# Patient Record
Sex: Female | Born: 1950 | Race: Black or African American | Hispanic: No | Smoking: Never smoker
Health system: Southern US, Community
[De-identification: ages and names within clinical notes are randomized; demographics above are authoritative.]

## PROBLEM LIST (undated history)

## (undated) DIAGNOSIS — Z9289 Personal history of other medical treatment: Secondary | ICD-10-CM

## (undated) DIAGNOSIS — I10 Essential (primary) hypertension: Secondary | ICD-10-CM

## (undated) DIAGNOSIS — K219 Gastro-esophageal reflux disease without esophagitis: Secondary | ICD-10-CM

## (undated) DIAGNOSIS — D649 Anemia, unspecified: Secondary | ICD-10-CM

## (undated) DIAGNOSIS — C4920 Malignant neoplasm of connective and soft tissue of unspecified lower limb, including hip: Secondary | ICD-10-CM

---

## 1992-11-30 HISTORY — PX: FOOT AMPUTATION: SHX951

## 2009-11-29 ENCOUNTER — Emergency Department (HOSPITAL_COMMUNITY): Admission: EM | Admit: 2009-11-29 | Discharge: 2009-11-29 | Payer: Self-pay | Admitting: Emergency Medicine

## 2009-12-18 ENCOUNTER — Encounter: Payer: Self-pay | Admitting: Internal Medicine

## 2010-12-21 ENCOUNTER — Encounter: Payer: Self-pay | Admitting: General Practice

## 2010-12-30 NOTE — Letter (Signed)
Summary: Pre Visit No Show Letter  [facility]  [address] [place], [place] [address]   Phone: [phone]  Fax: [phone]        December 18, 2009 MRN: [id number]    [facility] [address] [place], [place]  [address]    Dear Ms. Ey,   We have been unable to reach you by phone concerning the pre-procedure visit that you missed on 12/18/09. For this reason,your procedure scheduled on 12/31/09 has been cancelled. Our scheduling staff will gladly assist you with rescheduling your appointments at a more convenient time. Please call our office at [phone] between the hours of 8:00am and 5:00pm, press option #2 to reach an appointment scheduler. Please consider updating your contact numbers at this time so that we can reach you by phone in the future with schedule changes or results.    Thank you,    Wyona Almas RN [facility]

## 2011-01-06 ENCOUNTER — Other Ambulatory Visit: Payer: Self-pay | Admitting: Family Medicine

## 2011-01-06 DIAGNOSIS — Z1231 Encounter for screening mammogram for malignant neoplasm of breast: Secondary | ICD-10-CM

## 2011-01-14 ENCOUNTER — Ambulatory Visit
Admission: RE | Admit: 2011-01-14 | Discharge: 2011-01-14 | Disposition: A | Discharge status: Home / Self Care | Payer: Federal, State, Local not specified - PPO | Source: Ambulatory Visit | Attending: Family Medicine | Admitting: Family Medicine

## 2011-01-14 DIAGNOSIS — Z1231 Encounter for screening mammogram for malignant neoplasm of breast: Secondary | ICD-10-CM

## 2011-01-30 ENCOUNTER — Other Ambulatory Visit (HOSPITAL_COMMUNITY)
Admission: RE | Admit: 2011-01-30 | Discharge: 2011-01-30 | Disposition: A | Discharge status: Home / Self Care | Payer: Federal, State, Local not specified - PPO | Source: Ambulatory Visit | Attending: Family Medicine | Admitting: Family Medicine

## 2011-01-30 ENCOUNTER — Other Ambulatory Visit: Payer: Self-pay | Admitting: Family Medicine

## 2011-01-30 DIAGNOSIS — Z124 Encounter for screening for malignant neoplasm of cervix: Secondary | ICD-10-CM | POA: Insufficient documentation

## 2018-03-09 ENCOUNTER — Other Ambulatory Visit (HOSPITAL_COMMUNITY): Payer: Federal, State, Local not specified - PPO

## 2018-03-09 ENCOUNTER — Other Ambulatory Visit: Payer: Self-pay

## 2018-03-09 ENCOUNTER — Observation Stay (HOSPITAL_BASED_OUTPATIENT_CLINIC_OR_DEPARTMENT_OTHER): Payer: Federal, State, Local not specified - PPO

## 2018-03-09 ENCOUNTER — Encounter (HOSPITAL_COMMUNITY): Payer: Self-pay

## 2018-03-09 ENCOUNTER — Observation Stay (HOSPITAL_COMMUNITY)
Admission: EM | Admit: 2018-03-09 | Discharge: 2018-03-10 | Disposition: A | Discharge status: Home / Self Care | Payer: Federal, State, Local not specified - PPO | Attending: Student in an Organized Health Care Education/Training Program | Admitting: Student in an Organized Health Care Education/Training Program

## 2018-03-09 DIAGNOSIS — D649 Anemia, unspecified: Principal | ICD-10-CM | POA: Insufficient documentation

## 2018-03-09 DIAGNOSIS — D509 Iron deficiency anemia, unspecified: Secondary | ICD-10-CM | POA: Diagnosis present

## 2018-03-09 DIAGNOSIS — K0889 Other specified disorders of teeth and supporting structures: Secondary | ICD-10-CM | POA: Diagnosis not present

## 2018-03-09 DIAGNOSIS — R9431 Abnormal electrocardiogram [ECG] [EKG]: Secondary | ICD-10-CM | POA: Diagnosis not present

## 2018-03-09 DIAGNOSIS — R42 Dizziness and giddiness: Secondary | ICD-10-CM | POA: Diagnosis not present

## 2018-03-09 DIAGNOSIS — R51 Headache: Secondary | ICD-10-CM | POA: Diagnosis not present

## 2018-03-09 DIAGNOSIS — R739 Hyperglycemia, unspecified: Secondary | ICD-10-CM | POA: Diagnosis not present

## 2018-03-09 DIAGNOSIS — I129 Hypertensive chronic kidney disease with stage 1 through stage 4 chronic kidney disease, or unspecified chronic kidney disease: Secondary | ICD-10-CM | POA: Diagnosis not present

## 2018-03-09 DIAGNOSIS — I361 Nonrheumatic tricuspid (valve) insufficiency: Secondary | ICD-10-CM

## 2018-03-09 DIAGNOSIS — Z9889 Other specified postprocedural states: Secondary | ICD-10-CM

## 2018-03-09 DIAGNOSIS — Z8249 Family history of ischemic heart disease and other diseases of the circulatory system: Secondary | ICD-10-CM

## 2018-03-09 DIAGNOSIS — Z85831 Personal history of malignant neoplasm of soft tissue: Secondary | ICD-10-CM

## 2018-03-09 DIAGNOSIS — R0602 Shortness of breath: Secondary | ICD-10-CM | POA: Diagnosis not present

## 2018-03-09 DIAGNOSIS — J302 Other seasonal allergic rhinitis: Secondary | ICD-10-CM | POA: Diagnosis not present

## 2018-03-09 DIAGNOSIS — I10 Essential (primary) hypertension: Secondary | ICD-10-CM

## 2018-03-09 DIAGNOSIS — N183 Chronic kidney disease, stage 3 (moderate): Secondary | ICD-10-CM

## 2018-03-09 DIAGNOSIS — R12 Heartburn: Secondary | ICD-10-CM

## 2018-03-09 DIAGNOSIS — R110 Nausea: Secondary | ICD-10-CM | POA: Diagnosis not present

## 2018-03-09 DIAGNOSIS — Z89431 Acquired absence of right foot: Secondary | ICD-10-CM

## 2018-03-09 DIAGNOSIS — Z859 Personal history of malignant neoplasm, unspecified: Secondary | ICD-10-CM | POA: Insufficient documentation

## 2018-03-09 DIAGNOSIS — R011 Cardiac murmur, unspecified: Secondary | ICD-10-CM | POA: Diagnosis not present

## 2018-03-09 DIAGNOSIS — E876 Hypokalemia: Secondary | ICD-10-CM | POA: Diagnosis not present

## 2018-03-09 DIAGNOSIS — Z833 Family history of diabetes mellitus: Secondary | ICD-10-CM

## 2018-03-09 DIAGNOSIS — Z9289 Personal history of other medical treatment: Secondary | ICD-10-CM

## 2018-03-09 DIAGNOSIS — R197 Diarrhea, unspecified: Secondary | ICD-10-CM | POA: Diagnosis not present

## 2018-03-09 DIAGNOSIS — E669 Obesity, unspecified: Secondary | ICD-10-CM

## 2018-03-09 DIAGNOSIS — Z86010 Personal history of colonic polyps: Secondary | ICD-10-CM

## 2018-03-09 HISTORY — DX: Personal history of other medical treatment: Z92.89

## 2018-03-09 HISTORY — DX: Essential (primary) hypertension: I10

## 2018-03-09 HISTORY — DX: Iron deficiency anemia, unspecified: D50.9

## 2018-03-09 HISTORY — DX: Gastro-esophageal reflux disease without esophagitis: K21.9

## 2018-03-09 HISTORY — DX: Malignant neoplasm of connective and soft tissue of unspecified lower limb, including hip: C49.20

## 2018-03-09 HISTORY — DX: Anemia, unspecified: D64.9

## 2018-03-09 LAB — COMPREHENSIVE METABOLIC PANEL
ALBUMIN: 4.1 g/dL (ref 3.5–5.0)
ALT: 16 U/L (ref 14–54)
AST: 21 U/L (ref 15–41)
Alkaline Phosphatase: 45 U/L (ref 38–126)
Anion gap: 15 (ref 5–15)
BUN: 12 mg/dL (ref 6–20)
CHLORIDE: 102 mmol/L (ref 101–111)
CO2: 22 mmol/L (ref 22–32)
Calcium: 9.3 mg/dL (ref 8.9–10.3)
Creatinine, Ser: 1.12 mg/dL — ABNORMAL HIGH (ref 0.44–1.00)
GFR calc Af Amer: 58 mL/min — ABNORMAL LOW (ref >60)
GFR calc non Af Amer: 50 mL/min — ABNORMAL LOW (ref >60)
GLUCOSE: 190 mg/dL — AB (ref 65–99)
POTASSIUM: 2.6 mmol/L — AB (ref 3.5–5.1)
Sodium: 139 mmol/L (ref 135–145)
Total Bilirubin: 0.5 mg/dL (ref 0.3–1.2)
Total Protein: 7.5 g/dL (ref 6.5–8.1)

## 2018-03-09 LAB — TSH: TSH: 1.963 u[IU]/mL (ref 0.350–4.500)

## 2018-03-09 LAB — HEMOGLOBIN A1C
Hgb A1c MFr Bld: 5.9 % — ABNORMAL HIGH (ref 4.8–5.6)
Mean Plasma Glucose: 122.63 mg/dL

## 2018-03-09 LAB — CBC
HEMATOCRIT: 24 % — AB (ref 36.0–46.0)
Hemoglobin: 7.1 g/dL — ABNORMAL LOW (ref 12.0–15.0)
MCH: 18.7 pg — ABNORMAL LOW (ref 26.0–34.0)
MCHC: 29.6 g/dL — AB (ref 30.0–36.0)
MCV: 63.3 fL — AB (ref 78.0–100.0)
Platelets: 405 10*3/uL — ABNORMAL HIGH (ref 150–400)
RBC: 3.79 MIL/uL — ABNORMAL LOW (ref 3.87–5.11)
RDW: 21 % — AB (ref 11.5–15.5)
WBC: 7.7 10*3/uL (ref 4.0–10.5)

## 2018-03-09 LAB — LIPASE, BLOOD: LIPASE: 40 U/L (ref 11–51)

## 2018-03-09 LAB — URINALYSIS, ROUTINE W REFLEX MICROSCOPIC
BILIRUBIN URINE: NEGATIVE
GLUCOSE, UA: 150 mg/dL — AB
Hgb urine dipstick: NEGATIVE
KETONES UR: NEGATIVE mg/dL
Leukocytes, UA: NEGATIVE
Nitrite: NEGATIVE
PH: 8 (ref 5.0–8.0)
Protein, ur: NEGATIVE mg/dL
Specific Gravity, Urine: 1.006 (ref 1.005–1.030)

## 2018-03-09 LAB — BASIC METABOLIC PANEL
ANION GAP: 12 (ref 5–15)
BUN: 9 mg/dL (ref 6–20)
CO2: 22 mmol/L (ref 22–32)
Calcium: 9.4 mg/dL (ref 8.9–10.3)
Chloride: 105 mmol/L (ref 101–111)
Creatinine, Ser: 0.99 mg/dL (ref 0.44–1.00)
GFR calc Af Amer: >60 mL/min (ref >60)
GFR calc non Af Amer: 58 mL/min — ABNORMAL LOW (ref >60)
Glucose, Bld: 118 mg/dL — ABNORMAL HIGH (ref 65–99)
POTASSIUM: 3.4 mmol/L — AB (ref 3.5–5.1)
SODIUM: 139 mmol/L (ref 135–145)

## 2018-03-09 LAB — SAVE SMEAR

## 2018-03-09 LAB — LACTATE DEHYDROGENASE: LDH: 192 U/L (ref 98–192)

## 2018-03-09 LAB — IRON AND TIBC
IRON: 9 ug/dL — AB (ref 28–170)
Saturation Ratios: 2 % — ABNORMAL LOW (ref 10.4–31.8)
TIBC: 518 ug/dL — ABNORMAL HIGH (ref 250–450)
UIBC: 509 ug/dL

## 2018-03-09 LAB — RETICULOCYTES
RBC.: 3.98 MIL/uL (ref 3.87–5.11)
RETIC COUNT ABSOLUTE: 59.7 10*3/uL (ref 19.0–186.0)
Retic Ct Pct: 1.5 % (ref 0.4–3.1)

## 2018-03-09 LAB — MAGNESIUM: Magnesium: 2 mg/dL (ref 1.7–2.4)

## 2018-03-09 LAB — ECHOCARDIOGRAM COMPLETE
Height: 67 in
WEIGHTICAEL: 3040 [oz_av]

## 2018-03-09 LAB — FERRITIN: FERRITIN: 3 ng/mL — AB (ref 11–307)

## 2018-03-09 LAB — PREPARE RBC (CROSSMATCH)

## 2018-03-09 LAB — ABO/RH: ABO/RH(D): O POS

## 2018-03-09 LAB — TROPONIN I

## 2018-03-09 LAB — POC OCCULT BLOOD, ED: Fecal Occult Bld: NEGATIVE

## 2018-03-09 MED ORDER — POTASSIUM CHLORIDE CRYS ER 20 MEQ PO TBCR
40.0000 meq | EXTENDED_RELEASE_TABLET | Freq: Once | ORAL | Status: AC
  Administered 2018-03-09: 40 meq via ORAL
  Filled 2018-03-09: qty 2

## 2018-03-09 MED ORDER — PROMETHAZINE HCL 25 MG/ML IJ SOLN: 12.5000 mg | Freq: Four times a day (QID) | INTRAMUSCULAR | Status: DC | PRN

## 2018-03-09 MED ORDER — LOPERAMIDE HCL 2 MG PO CAPS: 2.0000 mg | ORAL_CAPSULE | ORAL | Status: DC | PRN

## 2018-03-09 MED ORDER — ONDANSETRON 4 MG PO TBDP
4.0000 mg | ORAL_TABLET | Freq: Once | ORAL | Status: AC | PRN
  Administered 2018-03-09: 4 mg via ORAL
  Filled 2018-03-09: qty 1

## 2018-03-09 MED ORDER — AMLODIPINE BESYLATE 5 MG PO TABS
5.0000 mg | ORAL_TABLET | Freq: Every day | ORAL | Status: DC
  Administered 2018-03-09 – 2018-03-10 (×2): 5 mg via ORAL
  Filled 2018-03-09 (×2): qty 1

## 2018-03-09 MED ORDER — CHLORTHALIDONE 25 MG PO TABS
25.0000 mg | ORAL_TABLET | Freq: Every day | ORAL | Status: DC
  Administered 2018-03-09 – 2018-03-10 (×2): 25 mg via ORAL
  Filled 2018-03-09 (×2): qty 1

## 2018-03-09 MED ORDER — FAMOTIDINE 10 MG PO TABS: 10.0000 mg | ORAL_TABLET | Freq: Every day | ORAL | Status: DC | PRN

## 2018-03-09 MED ORDER — ACETAMINOPHEN 500 MG PO TABS
1000.0000 mg | ORAL_TABLET | Freq: Four times a day (QID) | ORAL | Status: DC | PRN
  Administered 2018-03-09: 1000 mg via ORAL
  Filled 2018-03-09: qty 2

## 2018-03-09 MED ORDER — SODIUM CHLORIDE 0.9 % IV SOLN
Freq: Once | INTRAVENOUS | Status: AC
  Administered 2018-03-09: 1000 mL via INTRAVENOUS

## 2018-03-09 MED ORDER — POTASSIUM CHLORIDE 10 MEQ/100ML IV SOLN
10.0000 meq | Freq: Once | INTRAVENOUS | Status: AC
  Administered 2018-03-09: 10 meq via INTRAVENOUS
  Filled 2018-03-09: qty 100

## 2018-03-09 MED ORDER — HYDRALAZINE HCL 20 MG/ML IJ SOLN
5.0000 mg | Freq: Four times a day (QID) | INTRAMUSCULAR | Status: DC | PRN
Start: 2018-03-09 — End: 2018-03-10
  Administered 2018-03-09: 5 mg via INTRAVENOUS
  Administered 2018-03-10: 0.25 mg via INTRAVENOUS
  Filled 2018-03-09 (×2): qty 1

## 2018-03-09 NOTE — ED Notes (Signed)
Meal tray ordered for patient.

## 2018-03-09 NOTE — ED Notes (Signed)
GI Dr. At bedside.

## 2018-03-09 NOTE — ED Notes (Signed)
Pure wick in place 

## 2018-03-09 NOTE — Progress Notes (Signed)
  Echocardiogram 2D Echocardiogram has been performed.  Twanisha Foulk L Androw 03/09/2018, 3:09 PM

## 2018-03-09 NOTE — ED Triage Notes (Signed)
Pt arrives with family; pt states she woke up to go to the bathroom and had episodes of emesis and diarrhea causing some dizziness; Pt's family states she had similar episode on yesterday; pt denies eating foods in common with family member but states they both had drank the same store bought tea; Pt denies pain on arrival. Pt had 1 episode of diarrhea and 2 episodes of emesis; pt states she felt better after [facility]

## 2018-03-09 NOTE — ED Notes (Signed)
2 units of blood ready in the blood bank per Butch Penny. Nikki RN made aware @ [phone].

## 2018-03-09 NOTE — ED Notes (Signed)
Dr Dina Rich notified of hgb of 7.1 and k of 2.6

## 2018-03-09 NOTE — Consult Note (Signed)
Referring Provider:  Dr. Orland Jarred Primary Care Physician:  Patient, No Pcp Per Primary Gastroenterologist:  Dr. Acquanetta Sit  Reason for Consultation: Iron deficiency anemia  HPI: Kayla Williams is a 66 y.o. female admitted to the hospital today following a spell of weakness and dizziness.    On admission, she was found to have severe iron deficiency anemia, with a hemoglobin of 7.1, MCV 63, ferritin 3, iron saturation 2%.    However, it is noteworthy that the patient gives a history of chronic anemia, and her baseline hemoglobin is around 9.  Stool was Hemoccult negative.  The patient is not on any ulcerogenic medications nor chronic PPI therapy, although she does use occasional Zantac for postprandial reflux symptoms, which she has had for many years after eating spicy foods.  No alarm symptoms such as anorexia or weight loss, in fact, has actually gained weight recently.  Has regular bowel habits, with no visible bleeding such as melenic stools or maroon stools.  Apart from the occasional reflux and rare episodes of dysphagia (to meat or rice), the patient does not have any localizing GI tract symptoms.  In May 2011, the patient underwent endoscopy and colonoscopy by Dr. Acquanetta Sit because of heme positive stool.  At that time there was some mild gastritis but the test for H. pylori was negative, and the colonoscopy just showed 2 medium-sized adenomatous polyps (7 mm and 8 mm in size), for which the patient has not returned for follow-up.  The patient is currently being transfused.     Past Medical History:  Diagnosis Date  . Anemia   . Cancer ([place])   . Hypertension     History reviewed. No pertinent surgical history.  Prior to Admission medications   Not on File    Current Facility-Administered Medications  Medication Dose Route Frequency Provider Last Rate Last Dose  . amLODipine (NORVASC) tablet 5 mg  5 mg Oral Daily Shela Leff, MD   5 mg at 03/09/18 1302   . chlorthalidone (HYGROTON) tablet 25 mg  25 mg Oral Daily Shela Leff, MD   25 mg at 03/09/18 1701  . famotidine (PEPCID) tablet 10 mg  10 mg Oral Daily PRN Shela Leff, MD      . hydrALAZINE (APRESOLINE) injection 5 mg  5 mg Intravenous Q6H PRN Ledell Noss, MD   5 mg at 03/09/18 1712   No current outpatient medications on file.    Allergies as of 03/09/2018  . (No Known Allergies)    History reviewed. No pertinent family history.  Social History   Socioeconomic History  . Marital status: Divorced    Spouse name: Not on file  . Number of children: Not on file  . Years of education: Not on file  . Highest education level: Not on file  Occupational History  . Not on file  Social Needs  . Financial resource strain: Not on file  . Food insecurity:    Worry: Not on file    Inability: Not on file  . Transportation needs:    Medical: Not on file    Non-medical: Not on file  Tobacco Use  . Smoking status: Never Smoker  Substance and Sexual Activity  . Alcohol use: Not Currently  . Drug use: Not Currently  . Sexual activity: Not on file  Lifestyle  . Physical activity:    Days per week: Not on file    Minutes per session: Not on file  . Stress: Not on file  Relationships  . Social connections:    Talks on phone: Not on file    Gets together: Not on file    Attends religious service: Not on file    Active member of club or organization: Not on file    Attends meetings of clubs or organizations: Not on file    Relationship status: Not on file  . Intimate partner violence:    Fear of current or ex partner: Not on file    Emotionally abused: Not on file    Physically abused: Not on file    Forced sexual activity: Not on file  Other Topics Concern  . Not on file  Social History Narrative  . Not on file    Review of Systems: Negative for chest pain, shortness of breath, skin problems, urinary tract symptoms, or GI symptoms except per HPI.  Physical  Exam: Vital signs in last 24 hours: Temp:  [98 F (36.7 C)-99.5 F (37.5 C)] 99.5 F (37.5 C) (04/10 1715) Pulse Rate:  [70-93] 85 (04/10 1715) Resp:  [10-27] 27 (04/10 1715) BP: (167-255)/(59-128) 195/74 (04/10 1715) SpO2:  [98 %-100 %] 100 % (04/10 1715) Weight:  [86.2 kg (190 lb)] 86.2 kg (190 lb) (04/10 0515)   General:   Alert,  Well-developed, overweight, pleasant and cooperative African-American female in NAD Head:  Normocephalic and atraumatic. Eyes:  Sclera clear, no icterus.   Conjunctiva pink, s/p transfusion. Mouth:   No ulcerations or lesions.  Oropharynx pink & moist. Neck:   No masses or thyromegaly. Lungs:  Clear throughout to auscultation.   No wheezes, crackles, or rhonchi. No evident respiratory distress. Heart:   Regular rate and rhythm; no murmurs, clicks, rubs,  or gallops. Abdomen:  Soft, adipose, nontender. No masses, hepatosplenomegaly or ventral hernias noted. Msk:   Symmetrical without gross deformities status post partial amputation of 1 of her feet for malignancy. Extremities:   Without clubbing, cyanosis, or edema. Partial amputation of 1 foot noted. Neurologic:  Alert and coherent;  grossly normal neurologically. Skin:  Intact without significant lesions or rashes. Cervical Nodes:  No significant cervical adenopathy. Psych:   Alert and cooperative. Normal mood and affect.  Intake/Output from previous day: No intake/output data recorded. Intake/Output this shift: Total I/O In: 1099 [Blood:999; IV Piggyback:100] Out: -   Lab Results: Recent Labs    03/09/18 0517  WBC 7.7  HGB 7.1*  HCT 24.0*  PLT 405*   BMET Recent Labs    03/09/18 0517  NA 139  K 2.6*  CL 102  CO2 22  GLUCOSE 190*  BUN 12  CREATININE 1.12*  CALCIUM 9.3   LFT Recent Labs    03/09/18 0517  PROT 7.5  ALBUMIN 4.1  AST 21  ALT 16  ALKPHOS 45  BILITOT 0.5   PT/INR No results for input(s): LABPROT, INR in the last 72 hours.  Studies/Results: No results  found.  Impression: 1.  Acute on chronic anemia, with documented iron deficiency but heme-negative stool and no worrisome GI symptoms 2.  Mild reflux symptoms and mild dysphagia symptoms of long standing  Plan: In my opinion, the patient needs both endoscopy and colonoscopy.  However, especially with heme negative stool and no need for anticoagulation therapy, there is no urgency to performing these tests.  The patient prefers to have them done as an outpatient, which I feel is completely appropriate.  I have made her an appointment to see Dr. Acquanetta Sit in our office on April 23 at 11  AM to arrange egd/colonoscopy.  I will sign off.  However, please let me know if we can be of further assistance in this patient's care.   LOS: 0 days   Claudine Stallings V  03/09/2018, 5:48 PM   Pager [phone] If no answer or after 5 PM call [phone]

## 2018-03-09 NOTE — ED Notes (Signed)
Will preform Orthostatic Vital Signs when nurses finish hanging blood products.

## 2018-03-09 NOTE — ED Notes (Addendum)
Orthostatic vitals have been done and are documented in vital signs. See computer vitals from 1300 today.

## 2018-03-09 NOTE — H&P (Deleted)
Date: 03/09/2018               Patient Name:  Kayla Williams MRN: [id number]  DOB: 04/24/1951 Age / Sex: 67 y.o., female   PCP: Patient, No Pcp Per         Medical Service: Internal Medicine Teaching Service         Attending Physician: Dr. Evette Doffing, Mallie Mussel, *    First Contact: Miachel Roux, MS4 Pager: [phone]  Second Contact: Dr. Ledell Noss Pager: [phone]       After Hours (After 5p/  First Contact Pager: [phone]  weekends / holidays): Second Contact Pager: [phone]   Chief Complaint: dizziness  History of Present Illness: Kayla Williams is a 67 y.o. 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 She reports she awoke this morning around 4-5am and went to the bathroom to urinate. Upon returning to her bed, she began to feel as if the room was spinning and experienced nausea. She lay down and her symptoms improved, but she called to her boyfriend and daughter so that they may help her return to the bathroom. She was assisted to the bathroom, where she had a soft bowel movement. She subsequently continued to feel dizzy, weak and nauseated and was taken to the ED. She did not experience LOC, headache, vision changes, hearing changes, focal weakness, chest pain, SOB, abdominal pain, vomiting, diarrhea. She has not experienced any change in diet or fluid intake. She denies recent infectious symptoms. Postmenopausal for >10 years with no intermittent vaginal bleeding or spotting. Denied having melena or hematochezia.   Meds:  Occasional OTC Zantac and antacids  Allergies: Allergies as of 03/09/2018  . (No Known Allergies)   Past Medical History:  Diagnosis Date  . Anemia   . Cancer ([place])   . 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 - anemia  of uncertain etiology: pt was told her blood cells have "strange shape", which she states her twin sister also has - seasonal allergies treated intermittently with Zantac - hypertension, previously treated but discontinued per pt due to BP lowering with exercise - reports ambulatory monitoring due to nonpainful cardiac complaint - unremarkable per pt  Screening History: - normal mammography and Pap in 2012 per chart review. Pt reports colonoscopy performed in last 10 years with polyp removed, reportedly without atypical findings. Pt states she has not received other screening. No record of colonoscopy on Care Everywhere  Family History:  HTN - many members of family DM2 - brother  Social History: Ms. Carder lives with her boyfriend, daughter and granddaughter. She works in [employer] for the [employer] and is planning on retiring this year. She denies any tobacco, illicit drug use or alcohol use currently or in the past.   Review of Systems: Review of Systems  Constitutional: Negative for chills, diaphoresis, fever, malaise/fatigue and weight loss.  HENT: Negative for ear discharge, ear pain, hearing loss and tinnitus.   Eyes: Negative for blurred vision, double vision and discharge.  Respiratory: Negative for cough and shortness of breath.   Cardiovascular: Negative for chest pain, palpitations and leg swelling.  Gastrointestinal: Positive for heartburn and nausea. Negative for abdominal pain, constipation, diarrhea and vomiting.  Genitourinary: Positive for frequency. Negative for dysuria, flank pain, hematuria and urgency.  Musculoskeletal: Negative  for back pain, joint pain, myalgias and neck pain.  Skin: Negative for itching and rash.  Neurological: Positive for dizziness and weakness. Negative for sensory change, speech change, focal weakness, loss of consciousness and headaches.  Endo/Heme/Allergies: Does not bruise/bleed easily.    Physical Exam: Blood pressure (!) 182/79, pulse  80, temperature 99.2 F (37.3 C), temperature source Oral, resp. rate 14, height 5\' 7"  (1.702 m), weight 86.2 kg (190 lb), SpO2 100 %. General Appearance: Well developed, well nourished, in no acute distress laying in bed. Skin: Inspection of the skin reveals no rashes, ulcerations or petechiae. HEENT: Sclerae anicteric and conjunctivae pale and moist. EOMs intact, PERRLA. External inspection of the ears and nose showed no scars, lesions, or masses. Examination of ears with otoscope showing pearly white tympanic membranes; no erythema, drainage, or effusion noted. Lips, teeth, and gums showed normal mucosa. The oral mucosa, hard and soft palate, tongue and posterior pharynx were normal. Poor dentition with black, decaying teeth. Neck: Supple and symmetric. No thyroid enlargement, tenderness or masses.  Lungs: Normal WOB without any wheezing or crackles. Cardiovascular: Normal rate, regular rhythm, 2/6 crescendo-decrescendo systolic murmur best heard over LUSB. Carotid pulses normal and 2+ bilaterally without bruits. Peripheral pulses 2+ and symmetric. Abdomen: Soft and nontender with quiet bowel sounds. No palpable HSM.  Musculoskeletal: No joint tenderness or effusions noted. Muscle strength and tone normal. Extremities: No cyanosis, clubbing or edema. Neurologic: Alert and oriented x 4. CN II-XII grossly intact. No dysdiadochokinesia. HiNTS exam negative  Pertinent Labs: Hgb 7.1, MCV 63, Iron 9, TIBC 518, Ferritin 3, Platelets 405 K+ 2.6, glucose 190, Cr 1.12  EKG: personally reviewed my interpretation is questionable T wave abnormalities in inferior leads and NSR. Unable to compare with prior.   Assessment & Plan by Problem: Active Problems:   Symptomatic anemia  Symptomatic Iron deficiency anemia: Microcytic anemia with iron panel consistent with iron deficiency. Normal retic. Per Care Everywhere, pt had Hgb 10.8 with MCV 79 in 1996. Differential includes blood loss vs malabsorption vs  prolonged hemolysis. Blood loss workup thus far shows FOBT negative, no hematuria on UA. Hemolysis ruled out by normal bilirubin with LDH pending at this time. Smear in process. Spoke to GI as we are concerned she may have occult GIB. She will need colonoscopy and possible EGD to rule out mass or other source of bleeding.  - T&S followed by 2u pRBCs -F/u post-transfusion H/H -CBC in am -Goal Hgb >7 -Appreciate GI recs  Dizziness: Differential includes symptomatic anemia vs symptomatic severe range hypertension vs vasovagal vs arrhythmia (hypokalemic) vs neurological etiology. EKG in normal sinus rhythm and trop negative. Negative HiNTS exam, suggesting no central lesion. Meniere's unlikely given no hearing changes. BPPV unlikey given no positional provocation. No focal neurological findings suggestive of CVA. Orthostastic VS negative. -Continue to monitor on telemetry    Severe range hypertension with hypokalemia and decreased GFR: CKD 3a by GFR. Little historical data to determine temporality of kidney dysfunction; however, pt had nl Cr and K in 1996. Severe range HTN (220s/100s) with hypokalemia suggests secondary hypertension, with differential including hyperaldosteronism (primary vs secondary) vs ischemic nephropathy vs intrinsic renal disease. Other considerations include thyroid dysregulation or Cushing's syndrome although less likely based on history and exam.  -Start Chlorthalidone 25 mg daily -Start Amlodipine 5 mg daily  - plasma renin activity:plasma aldosterone concentration ratio - TSH  Hypokalemia: K 2.6 on admission. Unclear what is causing her to be hypokalemic. Not on any diuretics; no diarrhea. Questionable  TWI in inferior leads only on EKG; no prior EKG to compare. Hyperaldosteronism is a consideration in the setting of high blood pressures.  -Repeat BMP; monitor K level and replete as needed   Questionable TWI in inferior leads: No prior EKG to compare. Patient does not  endorse angina. Istat troponin negative.  -Echo to check for focal wall motion abnormality and further evaluation of murmur heard on exam  -Repeat EKG in am   Hyperglycemia: Given obesity, sedentary lifestyle, family history, pt likely has DM2 vs prediabetes.  - Hgb A1c  Diet: HH  DVT ppx: SCDs  Code status: Pt wishes to be be full code.  Dispo: Admit patient to Observation with expected length of stay less than 2 midnights.  Signed: Sarina Ser, Medical Student 03/09/2018, 2:20 PM  Pager: [phone]  Attestation for Student Documentation:  I personally was present and performed or re-performed the history, physical exam and medical decision-making activities of this service and have verified that the service and findings are accurately documented in the student's note.  Shela Leff, MD 03/09/2018, 3:02 PM

## 2018-03-09 NOTE — ED Provider Notes (Signed)
[facility] EMERGENCY DEPARTMENT Provider Note   CSN: [id number] Arrival date & time: 03/09/18  0503     History   Chief Complaint Chief Complaint  Patient presents with  . Emesis  . Diarrhea    HPI Kayla Williams is a 67 y.o. female.  HPI  67 year old female presents with a chief complaint of dizziness.  When she was getting up to go to the bathroom in the middle the night she felt dizzy like the room was spinning and she was going to pass out.  She was also nauseated.  She was given Zofran by a nurse upon arrival to ER and she feels better.  She denies any chest pain.  She has shortness of breath when she exerts herself but she feels like this is from being out of shape.  She denies any new or worsening shortness of breath.  She denies any rectal bleeding or vaginal bleeding.  No melena.  She states she has been told she has anemia but has not seen a doctor in a couple years.  Used to be on blood pressure medicine but took herself off and tried to control it with weight control.  She does not know what her hemoglobin normally is.  She had a couple episodes of diarrhea after the dizziness episode.  She has not been sick before last night.  Past Medical History:  Diagnosis Date  . Anemia   . Cancer ([place])   . Hypertension     Patient Active Problem List   Diagnosis Date Noted  . Symptomatic anemia 03/09/2018    History reviewed. No pertinent surgical history.   OB History   None      Home Medications    Prior to Admission medications   Not on File    Family History History reviewed. No pertinent family history.  Social History Social History   Tobacco Use  . Smoking status: Never Smoker  Substance Use Topics  . Alcohol use: Not Currently  . Drug use: Not Currently     Allergies   Patient has no known allergies.   Review of Systems Review of Systems  Constitutional: Negative for fever.  Respiratory: Positive for shortness of  breath (on exertion).   Cardiovascular: Negative for chest pain.  Gastrointestinal: Positive for diarrhea. Negative for abdominal pain and blood in stool.  Genitourinary: Negative for vaginal bleeding.  Neurological: Positive for dizziness. Negative for syncope and headaches.  All other systems reviewed and are negative.    Physical Exam Updated Vital Signs BP (!) 194/71   Pulse 74   Temp 99.2 F (37.3 C) (Oral)   Resp 17   Ht 5\' 7"  (1.702 m)   Wt 86.2 kg (190 lb)   SpO2 100%   BMI 29.76 kg/m   Physical Exam  Constitutional: She is oriented to person, place, and time. She appears well-developed and well-nourished. No distress.  HENT:  Head: Normocephalic and atraumatic.  Right Ear: External ear normal.  Left Ear: External ear normal.  Nose: Nose normal.  Eyes: Pupils are equal, round, and reactive to light. EOM are normal. Right eye exhibits no discharge. Left eye exhibits no discharge.  Cardiovascular: Normal rate and regular rhythm.  Murmur heard. Pulmonary/Chest: Effort normal and breath sounds normal.  Abdominal: Soft. There is no tenderness.  Genitourinary:  Genitourinary Comments: No hemorrhoids. Brown stool on rectal exam  Neurological: She is alert and oriented to person, place, and time.  CN 3-12 grossly intact. 5/5 strength  in all 4 extremities. Grossly normal sensation. Normal finger to nose.   Skin: Skin is warm and dry. She is not diaphoretic.  Nursing note and vitals reviewed.    ED Treatments / Results  Labs (all labs ordered are listed, but only abnormal results are displayed) Labs Reviewed  COMPREHENSIVE METABOLIC PANEL - Abnormal; Notable for the following components:      Result Value   Potassium 2.6 (*)    Glucose, Bld 190 (*)    Creatinine, Ser 1.12 (*)    GFR calc non Af Amer 50 (*)    GFR calc Af Amer 58 (*)    All other components within normal limits  CBC - Abnormal; Notable for the following components:   RBC 3.79 (*)    Hemoglobin  7.1 (*)    HCT 24.0 (*)    MCV 63.3 (*)    MCH 18.7 (*)    MCHC 29.6 (*)    RDW 21.0 (*)    Platelets 405 (*)    All other components within normal limits  URINALYSIS, ROUTINE W REFLEX MICROSCOPIC - Abnormal; Notable for the following components:   Color, Urine COLORLESS (*)    Glucose, UA 150 (*)    All other components within normal limits  IRON AND TIBC - Abnormal; Notable for the following components:   Iron 9 (*)    TIBC 518 (*)    Saturation Ratios 2 (*)    All other components within normal limits  FERRITIN - Abnormal; Notable for the following components:   Ferritin 3 (*)    All other components within normal limits  HEMOGLOBIN A1C - Abnormal; Notable for the following components:   Hgb A1c MFr Bld 5.9 (*)    All other components within normal limits  LIPASE, BLOOD  MAGNESIUM  RETICULOCYTES  TROPONIN I  SAVE SMEAR  TSH  BASIC METABOLIC PANEL  LACTATE DEHYDROGENASE  ALDOSTERONE + RENIN ACTIVITY W/ RATIO  POC OCCULT BLOOD, ED  TYPE AND SCREEN  ABO/RH  PREPARE RBC (CROSSMATCH)    EKG EKG Interpretation  Date/Time:  Wednesday March 09 2018 07:39:14 EDT Ventricular Rate:  76 PR Interval:  166 QRS Duration: 96 QT Interval:  440 QTC Calculation: 495 R Axis:   75 Text Interpretation:  Normal sinus rhythm ST & T wave abnormality, consider inferior ischemia Prolonged QT Abnormal ECG No old tracing to compare Confirmed by Sherwood Gambler [phone]) on 03/09/2018 9:44:19 AM   Radiology No results found.  Procedures Procedures (including critical care time)  Medications Ordered in ED Medications  famotidine (PEPCID) tablet 10 mg (has no administration in time range)  chlorthalidone (HYGROTON) tablet 25 mg (has no administration in time range)  amLODipine (NORVASC) tablet 5 mg (5 mg Oral Given 03/09/18 1302)  ondansetron (ZOFRAN-ODT) disintegrating tablet 4 mg (4 mg Oral Given 03/09/18 0747)  potassium chloride SA (K-DUR,KLOR-CON) CR tablet 40 mEq (40 mEq Oral Given  03/09/18 1041)  potassium chloride 10 mEq in 100 mL IVPB (0 mEq Intravenous Stopped 03/09/18 1208)  0.9 %  sodium chloride infusion (1,000 mLs Intravenous New Bag/Given 03/09/18 1302)     Initial Impression / Assessment and Plan / ED Course  I have reviewed the triage vital signs and the nursing notes.  Pertinent labs & imaging results that were available during my care of the patient were reviewed by me and considered in my medical decision making (see chart for details).     Patient's anemia is unclear etiology.  It is also  unclear how long is been going on or what her baseline is.  However this would explain some dizziness.  She does not have any focal neurologic deficits and I think a CNS etiology is unlikely.  She does have hypokalemia without clear source and I doubt is from the 2 diarrheal bowel movements she has had.  At this point, she is quite hypertensive which is likely chronic given she is to have hypertension stopped her meds.  She will need further workup, blood pressure control, and monitoring of her hemoglobin.  Thus she will be admitted to the internal medicine teaching service.  Final Clinical Impressions(s) / ED Diagnoses   Final diagnoses:  Anemia, unspecified type  Hypokalemia    ED Discharge Orders    None       Sherwood Gambler, MD 03/09/18 1605

## 2018-03-09 NOTE — ED Notes (Signed)
Resident MD, paged about prn meds and to notify of HTN.

## 2018-03-09 NOTE — ED Notes (Signed)
Provider at bedside & RN from 3W RN here to retrieve patient.

## 2018-03-09 NOTE — Progress Notes (Signed)
I was paged regarding headache and persistent hypertension.  I went to the bedside to evaluate Kayla Williams. She says that she does not get headaches often but developed a headache similar to the ones that she has experienced in the past while waiting in the ED waiting room. The headache is bandlike and associated with nausea. She denies changes in vision or neurologic changes. The nausea improved with zofran in the ED and the headache has improved somewhat with warm washcloth placed over her face.  CN II-XII grossly intact, PERRLA, strength and sensation in the upper and lower extremities are intact. She has regular rate and rhythm, no murmurs.   - zofran ordered for nausea  - Tylenol for headache  - she will notify her nurse if the headache is not improved with tylenol, worsens, or becomes associated with other symptoms.  - Blood pressure is 180/66 ( map 104) which is close to the MAP goal of 96 by tomorrow morning. Nursing will repeat blood pressure with manual reading, we may need to use a higher dose of hydralazine

## 2018-03-09 NOTE — ED Notes (Signed)
Pt given new bed sheets and changed, pt back in bed resting.

## 2018-03-09 NOTE — H&P (Addendum)
Date: 03/09/2018               Patient Name:  Kayla Williams MRN: [id number]  DOB: 12-02-1950 Age / Sex: 67 y.o., female   PCP: Patient, No Pcp Per         Medical Service: Internal Medicine Teaching Service         Attending Physician: Dr. Evette Williams, Kayla Williams, *    First Contact: Kayla Williams, MS4 Pager: [phone]  Second Contact: Dr. Ledell Williams Pager: [phone]       After Hours (After 5p/  First Contact Pager: [phone]  weekends / holidays): Second Contact Pager: [phone]   Chief Complaint: dizziness  History of Present Illness: Kayla Williams is a 67 y.o. 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 She reports she awoke this morning around 4-5am and went to the bathroom to urinate. Upon returning to her bed, she began to feel as if the room was spinning and experienced nausea. She lay down and her symptoms improved, but she called to her boyfriend and daughter so that they may help her return to the bathroom. She was assisted to the bathroom, where she had a soft bowel movement. She subsequently continued to feel dizzy, weak and nauseated and was taken to the ED. She did not experience LOC, headache, vision changes, hearing changes, focal weakness, chest pain, SOB, abdominal pain, vomiting, diarrhea. She has not experienced any change in diet or fluid intake. She denies recent infectious symptoms. Postmenopausal for >10 years with no intermittent vaginal bleeding or spotting. Denied having melena or hematochezia.   Meds:  Occasional OTC Zantac and antacids  Allergies: Allergies as of 03/09/2018  . (No Known Allergies)   Past Medical History:  Diagnosis Date  . Anemia   . Cancer ([place])   . 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 - anemia  of uncertain etiology: pt was told her blood cells have "strange shape", which she states her twin sister also has - seasonal allergies treated intermittently with Zantac - hypertension, previously treated but discontinued per pt due to BP lowering with exercise - reports ambulatory monitoring due to nonpainful cardiac complaint - unremarkable per pt  Screening History: - normal mammography and Pap in 2012 per chart review. Pt reports colonoscopy performed in last 10 years with polyp removed, reportedly without atypical findings. Pt states she has not received other screening. No record of colonoscopy on Care Everywhere  Family History:  HTN - many members of family DM2 - brother  Social History: [name] lives with her boyfriend, daughter and granddaughter. She works in [employer] for the [employer] and is planning on retiring this year. She denies any tobacco, illicit drug use or alcohol use currently or in the past.   Review of Systems: Review of Systems  Constitutional: Negative for chills, diaphoresis, fever, malaise/fatigue and weight loss.  HENT: Negative for ear discharge, ear pain, hearing loss and tinnitus.   Eyes: Negative for blurred vision, double vision and discharge.  Respiratory: Negative for cough and shortness of breath.   Cardiovascular: Negative for chest pain, palpitations and leg swelling.  Gastrointestinal: Positive for heartburn and nausea. Negative for abdominal pain, constipation, diarrhea and vomiting.  Genitourinary: Positive for frequency. Negative for dysuria, flank pain, hematuria and urgency.  Musculoskeletal: Negative  for back pain, joint pain, myalgias and neck pain.  Skin: Negative for itching and rash.  Neurological: Positive for dizziness and weakness. Negative for sensory change, speech change, focal weakness, loss of consciousness and headaches.  Endo/Heme/Allergies: Does not bruise/bleed easily.    Physical Exam: Blood pressure (!) 195/74, pulse  85, temperature 99.5 F (37.5 C), temperature source Oral, resp. rate (!) 27, height 5\' 7"  (1.702 m), weight 190 lb (86.2 kg), SpO2 100 %. General Appearance: Well developed, well nourished, in no acute distress laying in bed. Skin: Inspection of the skin reveals no rashes, ulcerations or petechiae. HEENT: Sclerae anicteric and conjunctivae pale and moist. EOMs intact, PERRLA. External inspection of the ears and nose showed no scars, lesions, or masses. Examination of ears with otoscope showing pearly white tympanic membranes; no erythema, drainage, or effusion noted. Lips, teeth, and gums showed normal mucosa. The oral mucosa, hard and soft palate, tongue and posterior pharynx were normal. Poor dentition with black, decaying teeth. Neck: Supple and symmetric. No thyroid enlargement, tenderness or masses.  Lungs: Normal WOB without any wheezing or crackles. Cardiovascular: Normal rate, regular rhythm, 2/6 crescendo-decrescendo systolic murmur best heard over LUSB. Carotid pulses normal and 2+ bilaterally without bruits. Peripheral pulses 2+ and symmetric. Abdomen: Soft and nontender with quiet bowel sounds. No palpable HSM.  Musculoskeletal: No joint tenderness or effusions noted. Muscle strength and tone normal. Extremities: No cyanosis, clubbing or edema. Neurologic: Alert and oriented x 4. CN II-XII grossly intact. No dysdiadochokinesia. HiNTS exam negative  Pertinent Labs: Hgb 7.1, MCV 63, Iron 9, TIBC 518, Ferritin 3, Platelets 405 K+ 2.6, glucose 190, Cr 1.12  EKG: personally reviewed my interpretation is questionable T wave abnormalities in inferior leads and NSR. Unable to compare with prior.   Assessment & Plan by Problem: Active Problems:   Symptomatic anemia  Symptomatic Iron deficiency anemia: Microcytic anemia with iron panel consistent with iron deficiency. Normal retic. Per Care Everywhere, pt had Hgb 10.8 with MCV 79 in 1996. Differential includes blood loss vs malabsorption  vs prolonged hemolysis. Blood loss workup thus far shows FOBT negative, no hematuria on UA. Hemolysis ruled out by normal bilirubin with LDH pending at this time. Smear in process. Spoke to GI as we are concerned she may have occult GIB. She will need colonoscopy and possible EGD to rule out mass or other source of bleeding.  - T&S followed by 2u pRBCs -F/u post-transfusion H/H -CBC in am -Goal Hgb >7 -Appreciate GI recs, [facility] patient and they have been consulted   Dizziness: Differential includes symptomatic anemia vs symptomatic severe range hypertension vs vasovagal vs arrhythmia (hypokalemic) vs neurological etiology. EKG in normal sinus rhythm and trop negative. Negative HiNTS exam, suggesting no central lesion. Meniere's unlikely given no hearing changes. BPPV unlikey given no positional provocation. No focal neurological findings suggestive of CVA. Orthostastic VS negative. -Continue to monitor on telemetry    Severe range hypertension with hypokalemia and decreased GFR: CKD 3a by GFR. Little historical data to determine temporality of kidney dysfunction; however, pt had nl Cr and K in 1996. Severe range HTN (220s/100s) with hypokalemia suggests secondary hypertension, with differential including hyperaldosteronism (primary vs secondary) vs ischemic nephropathy vs intrinsic renal disease. Other considerations include thyroid dysregulation or Cushing's syndrome although less likely based on history and exam.  -Start Chlorthalidone 25 mg daily -Start Amlodipine 5 mg daily  - plasma renin activity:plasma aldosterone concentration ratio - TSH  Hypokalemia: K 2.6 on admission. Unclear what is causing her  to be hypokalemic. Not on any diuretics; no diarrhea. Questionable TWI in inferior leads only on EKG; no prior EKG to compare. Hyperaldosteronism is a consideration in the setting of high blood pressures.  -Repeat BMP; monitor K level and replete as needed   Questionable TWI in inferior  leads: No prior EKG to compare. Patient does not endorse angina. Istat troponin negative.  -Echo to check for focal wall motion abnormality and further evaluation of murmur heard on exam  -Repeat EKG in am   Hyperglycemia: Given obesity, sedentary lifestyle, family history, pt likely has DM2 vs prediabetes.  - Hgb A1c  Diet: HH  DVT ppx: SCDs  Code status: Pt wishes to be be full code.  Dispo: Admit patient to Observation with expected length of stay less than 2 midnights.  Signed: Miachel Williams MS4 03/09/2018, 5:24 PM  Pager: [phone]  Attestation for Student Documentation:  I personally was present and performed or re-performed the history, physical exam and medical decision-making activities of this service and have verified that the service and findings are accurately documented in the student's note.  Shela Leff, MD 03/10/2018, 9:15 AM

## 2018-03-10 DIAGNOSIS — J302 Other seasonal allergic rhinitis: Secondary | ICD-10-CM | POA: Diagnosis not present

## 2018-03-10 DIAGNOSIS — R7303 Prediabetes: Secondary | ICD-10-CM | POA: Diagnosis not present

## 2018-03-10 DIAGNOSIS — E876 Hypokalemia: Secondary | ICD-10-CM | POA: Diagnosis not present

## 2018-03-10 DIAGNOSIS — Z89431 Acquired absence of right foot: Secondary | ICD-10-CM | POA: Diagnosis not present

## 2018-03-10 DIAGNOSIS — R011 Cardiac murmur, unspecified: Secondary | ICD-10-CM | POA: Diagnosis not present

## 2018-03-10 DIAGNOSIS — D649 Anemia, unspecified: Secondary | ICD-10-CM | POA: Diagnosis not present

## 2018-03-10 DIAGNOSIS — I129 Hypertensive chronic kidney disease with stage 1 through stage 4 chronic kidney disease, or unspecified chronic kidney disease: Secondary | ICD-10-CM | POA: Diagnosis not present

## 2018-03-10 DIAGNOSIS — Z85831 Personal history of malignant neoplasm of soft tissue: Secondary | ICD-10-CM | POA: Diagnosis not present

## 2018-03-10 DIAGNOSIS — R9431 Abnormal electrocardiogram [ECG] [EKG]: Secondary | ICD-10-CM | POA: Diagnosis not present

## 2018-03-10 DIAGNOSIS — R739 Hyperglycemia, unspecified: Secondary | ICD-10-CM | POA: Diagnosis not present

## 2018-03-10 DIAGNOSIS — I10 Essential (primary) hypertension: Secondary | ICD-10-CM

## 2018-03-10 DIAGNOSIS — Z9889 Other specified postprocedural states: Secondary | ICD-10-CM | POA: Diagnosis not present

## 2018-03-10 DIAGNOSIS — R110 Nausea: Secondary | ICD-10-CM | POA: Diagnosis not present

## 2018-03-10 DIAGNOSIS — N183 Chronic kidney disease, stage 3 (moderate): Secondary | ICD-10-CM | POA: Diagnosis not present

## 2018-03-10 DIAGNOSIS — D509 Iron deficiency anemia, unspecified: Secondary | ICD-10-CM | POA: Diagnosis not present

## 2018-03-10 LAB — BASIC METABOLIC PANEL
ANION GAP: 10 (ref 5–15)
BUN: 8 mg/dL (ref 6–20)
CALCIUM: 9.4 mg/dL (ref 8.9–10.3)
CO2: 25 mmol/L (ref 22–32)
CREATININE: 1.18 mg/dL — AB (ref 0.44–1.00)
Chloride: 104 mmol/L (ref 101–111)
GFR, EST AFRICAN AMERICAN: 54 mL/min — AB (ref >60)
GFR, EST NON AFRICAN AMERICAN: 47 mL/min — AB (ref >60)
Glucose, Bld: 104 mg/dL — ABNORMAL HIGH (ref 65–99)
Potassium: 3.1 mmol/L — ABNORMAL LOW (ref 3.5–5.1)
SODIUM: 139 mmol/L (ref 135–145)

## 2018-03-10 LAB — CBC
HCT: 33.1 % — ABNORMAL LOW (ref 36.0–46.0)
Hemoglobin: 10.7 g/dL — ABNORMAL LOW (ref 12.0–15.0)
MCH: 22.1 pg — ABNORMAL LOW (ref 26.0–34.0)
MCHC: 32.3 g/dL (ref 30.0–36.0)
MCV: 68.4 fL — ABNORMAL LOW (ref 78.0–100.0)
PLATELETS: 338 10*3/uL (ref 150–400)
RBC: 4.84 MIL/uL (ref 3.87–5.11)
RDW: 23.6 % — ABNORMAL HIGH (ref 11.5–15.5)
WBC: 9.8 10*3/uL (ref 4.0–10.5)

## 2018-03-10 LAB — TYPE AND SCREEN
ABO/RH(D): O POS
Antibody Screen: NEGATIVE
UNIT DIVISION: 0
Unit division: 0

## 2018-03-10 LAB — BPAM RBC
BLOOD PRODUCT EXPIRATION DATE: 201905032359
Blood Product Expiration Date: 201904162359
ISSUE DATE / TIME: 201904101630
ISSUE DATE / TIME: 201904101321
Unit Type and Rh: 5100
Unit Type and Rh: 5100

## 2018-03-10 MED ORDER — POLYSACCHARIDE IRON COMPLEX 150 MG PO CAPS: 150.0000 mg | ORAL_CAPSULE | Freq: Every day | ORAL | 0 refills | Status: DC

## 2018-03-10 MED ORDER — AMLODIPINE BESYLATE 5 MG PO TABS: 5.0000 mg | ORAL_TABLET | Freq: Every day | ORAL | 0 refills | Status: DC

## 2018-03-10 MED ORDER — POLYSACCHARIDE IRON COMPLEX 150 MG PO CAPS: 150.0000 mg | ORAL_CAPSULE | Freq: Every day | ORAL | Status: DC

## 2018-03-10 MED ORDER — LISINOPRIL 10 MG PO TABS: 10.0000 mg | ORAL_TABLET | Freq: Every day | ORAL | 0 refills | Status: DC

## 2018-03-10 MED ORDER — POTASSIUM CHLORIDE CRYS ER 20 MEQ PO TBCR: 20.0000 meq | EXTENDED_RELEASE_TABLET | Freq: Every day | ORAL | Status: DC

## 2018-03-10 MED ORDER — LISINOPRIL 10 MG PO TABS: 10.0000 mg | ORAL_TABLET | Freq: Every day | ORAL | Status: DC

## 2018-03-10 MED ORDER — POTASSIUM CHLORIDE CRYS ER 20 MEQ PO TBCR
40.0000 meq | EXTENDED_RELEASE_TABLET | Freq: Four times a day (QID) | ORAL | Status: DC
  Administered 2018-03-10: 40 meq via ORAL
  Filled 2018-03-10: qty 2

## 2018-03-10 MED ORDER — POTASSIUM CHLORIDE CRYS ER 20 MEQ PO TBCR: 20.0000 meq | EXTENDED_RELEASE_TABLET | Freq: Every day | ORAL | 0 refills | Status: DC

## 2018-03-10 NOTE — Discharge Summary (Addendum)
Name: Kayla Williams MRN: [id number] DOB: 1951-11-06 67 y.o. PCP: Patient, No Pcp Per  Date of Admission: 03/09/2018  5:10 AM Date of Discharge:  Attending Physician: Axel Filler, *  Discharge Diagnosis: Active Problems:   Symptomatic anemia   Hypertensive urgency   Discharge Medications: Allergies as of 03/10/2018   No Known Allergies     Medication List    TAKE these medications   amLODipine 5 MG tablet Commonly known as:  NORVASC Take 1 tablet (5 mg total) by mouth daily. Start taking on:  03/11/2018   iron polysaccharides 150 MG capsule Commonly known as:  NIFEREX Take 1 capsule (150 mg total) by mouth daily. Start taking on:  03/11/2018   lisinopril 10 MG tablet Commonly known as:  PRINIVIL,ZESTRIL Take 1 tablet (10 mg total) by mouth daily. Start taking on:  03/11/2018   potassium chloride SA 20 MEQ tablet Commonly known as:  K-DUR,KLOR-CON Take 1 tablet (20 mEq total) by mouth daily. Start taking on:  03/11/2018       Disposition and follow-up:   Kayla Williams was discharged from [facility] in Stable condition.  At the hospital follow up visit please address:  1.  Symptomatic iron deficiency anemia: - ensure adherence to Nu-iron 150 mg po qd - recheck iron studies and Hgb  - ensure follow-up in GI clinic for EGD and colonoscopy scheduled for 4/23  Severe range HTN with decreased GFR:  - recheck BP - ensure adherence to lisinopril 10 mg po qd and amlodipine 5 mg po qd  Hypokalemia: - recheck K - ensure adherence to KCl 20 mEq po qd  2.  Labs / imaging needed at time of follow-up: - repeat EKG for comparison   3.  Pending labs/ test needing follow-up: none  Follow-up Appointments: Follow-up Information    [place] Go on 03/22/2018.   Why:  Appointment at 2:15 pm.  Contact information: [address] [facility]  with Dr. Acquanetta Sit 4/23 1100 to arrange EGD/colonoscopy   Hospital Course by problem list: Active Problems:   Symptomatic anemia   Hypertensive urgency   1. Symptomatic iron deficiency anemia: Pt presented with episodes of dizziness likely secondary to symptomatic anemia as pt was found to have Hgb 7.1 on admission. Microcytic anemia with iron deficiency, low ferritin, elevated TIBC, abnormally normal range retic consistent with iron deficiency. Favor chronic occult blood loss despite negative FOBT and no hematuria on UA. GI consulted, recommend EGD and colonoscopy. Pt was transfused with 2u pRBC with improvement of Hgb to 10.7 on date of discharge. She was started on po iron.  2. Severe range hypertension with decreased GFR: Pt presented with hypertension (230s/100s) and mild headache and nausea. CMP showed hypokalemia, increasing suspicion for secondary hypertension, though no coinciding alkalosis. Plasma renin and aldosterone activity not suggestive of primary hyperaldosteronism. BP managed inpatient with po chlorthalidone, po amlodipine and IV hydralazine prn severe HTN. Chlorthalidone switched for lisinopril 10 mg po qd at time of discharge given risk of hypokalemia with thiazides.   3. Hypokalemia: K 2.6 on admission. Improved with K supplementation. Differential includes GI losses vs transient stress response. Pt takes no beta-2 agonists and is not hyperinsulinemic (A1c 5.7). Mg normal. Pt was started on oral potassium supplementation at time of discharge.   4. Questionable T wave inversions in inferior leads: Unable to compare with previous EKGs.  Pt exhibited no anginal symptoms.   5. Prediabetic range hyperglycemia: A1c 5.9. We recommend continued counseling on dietary modification and aerobic exercise.   Discharge Vitals:   BP (!) 182/91 (BP Location: Left Arm)   Pulse 68   Temp 98.1 F (36.7 C) (Oral)   Resp 16   Ht 5\' 7"  (1.702 m)   Wt 86.2 kg (190 lb)   SpO2 100%   BMI 29.76  kg/m   Pertinent Labs, Studies, and Procedures:  As listed in hospital course  Discharge Instructions:  You were admitted to [facility] for dizziness, high blood pressure and low blood count, also known as anemia. We believe you became dizzy because of your anemia. You received a blood transfusion to correct your anemia. Anemia is often caused by light bleeding over a long period of time from the gastrointestinal tract. We were unable to identify a source of bleeding during your hospitalization but would like to follow up outpatient to perform endoscopy and colonoscopy. These are procedures where we insert a camera into your GI tract and look for sources of bleeding. We would also like to see you to manage your high blood pressure. Please follow the following instructions:  For your anemia:  - Please start taking iron tablets (one tablet by mouth once daily)  For your high blood pressure: - Please take amlodipine 5 mg (one tablet by mouth once daily) and lisinopril 10 mg (one tablet by mouth once daily)  Signed: Sarina Ser, Medical Student 03/10/2018, 11:38 AM   Pager: [phone]  Attestation for Student Documentation:  I personally was present and performed or re-performed the history, physical exam and medical decision-making activities of this service and have verified that the service and findings are accurately documented in the student's note.  Shela Leff, MD 03/15/2018, 7:44 AM

## 2018-03-10 NOTE — Discharge Instructions (Signed)
You were admitted to [facility] for dizziness, high blood pressure and low blood count, also known as anemia. We believe you became dizzy because of your anemia. You received a blood transfusion to correct your anemia. Anemia is often caused by light bleeding over a long period of time from the gastrointestinal tract. We were unable to identify a source of bleeding during your hospitalization but would like to follow up outpatient to perform endoscopy and colonoscopy. These are procedures where we insert a camera into your GI tract and look for sources of bleeding. We would also like to see you to manage your high blood pressure. Please follow the following instructions:  For your anemia:  - Please start taking iron tablets (one tablet by mouth once daily)  For your high blood pressure: - Please take amlodipine 5 mg (one tablet by mouth once daily) and lisinopril 10 mg (one tablet by mouth once daily)  It is important for [place] to see you again in our clinic to manage your anemia and high blood pressure. Please follow up on April 23 at 2:15 PM at the [facility].  You also have an appointment with the gastrointestinal team on April 23 at 11:00 AM to discuss endoscopy and colonoscopy.

## 2018-03-10 NOTE — Progress Notes (Signed)
Subjective: Kayla Williams reports overnight headache that resolved with Tylenol. She reports her dizziness has resolved, though she has yet to ambulate significantly. She tolerated dinner overnight and breakfast this morning with no nausea. She has not had any BMs since arriving at the hospital.   Objective:  Vital signs in last 24 hours: Vitals:   03/09/18 1958 03/10/18 0004 03/10/18 0404 03/10/18 0728  BP: (!) 185/86 (!) 177/71 (!) 161/68 (!) 176/77  Pulse: 73 74 77 75  Resp: 18 20 20 16   Temp: 98.8 F (37.1 C) 98.6 F (37 C) 98.5 F (36.9 C) 98.1 F (36.7 C)  TempSrc: Oral Oral Oral Oral  SpO2: 100% 100% 100% 98%  Weight:      Height:       Physical Exam: General Appearance: Well developed, well nourished, in no acute distress laying in bed. Lungs: Normal WOB without any wheezing or crackles. Cardiovascular: Normal rate, regular rhythm, 2/6 crescendo-decrescendo systolic murmur best heard over LUSB. Peripheral pulses 2+ and symmetric. Abdomen: Soft and nontender with normal bowel sounds. No palpable HSM.  Extremities: No cyanosis, clubbing or edema.  Pertinent Labs: Hgb 7.1 (4/11)-->10.7 (4/11)  K 3.1, Cr 1.18, GFR 54 A1c 5.9  EKG: NSR with nonspecific T wave abnormalities  Assessment/Plan:  Active Problems:   Symptomatic anemia  Kayla Williams is a 67 y.o. 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 Her workup thus far is most consistent with symptomatic anemia and multifactorial increase from baseline hypertension.  Symptomatic iron deficiency anemia: Microcytic anemia with iron deficiency, low ferritin, elevated TIBC, abnormally normal range retic consistent with iron deficiency, responsive to pRBC transfusion. Likely causative of her dizziness on presentation.  Favor chronic occult blood loss with differential including malabsorption and hemolysis. FOBT negative and no hematuria on UA. Hemolysis unlikely given normal bilirubin and LDH. Malabsorption unlikely given no history of chronic diarrhea or signs of other nutritional deficiency. GI consulted and will perform outpatient EGD and colonoscopy. - Nu-iron 150 mg po qd, continue outpatient - follow up in GI clinic for EGD and colonoscopy 4/23  Severe range HTN with decreased GFR: CKD 3a by GFR. Severe range HTN with hypokalemia suggests secondary hypertension, with differential including stress response vs hyperaldosteronism (primary vs secondary) vs ischemic nephropathy vs intrinsic renal disease. At this time, favor essential HTN with superimposed acute stress response in setting of hospitalization leading to cortisol-induced hypokalemia. TSH WNL. BP has improved but requires close outpatient follow-up.  - follow renin:aldo ratio - discontinue chlorthalidone as this may cause hypokalemia - start lisinopril 10 mg po qd, continue outpatient - continue amlodipine 5 mg po qd - follow in clinic for outpatient management.   Hypokalemia: Differential includes transient stress response vs GI losses vs hyperaldosteronism as detailed above. No strong evidence for GI losses. No vomiting to suggest volume contraction with subsequent hypokalemia. No B2 agonist medications taken. No signs of increased insulin effect (A1c 5.9). Mg normal. We recommend potassium recheck outpatient to assess for persistent hypokalemia prior to expanding workup.  - d/c chlorthalidone, switch to lisinopril  - po K repletion today, continue KCl 20 mEq po qd as outpatient  Questionable TWI in inferior leads: No anginal symptoms. Difficult to determine if these are new changes given lack of historical EKG. T wave changes inconsistent with findings typical of hypokalemia.  -  continue to monitor outpatient  Prediabetic range hyperglycemia: A1c  5.9. Pt endorses poor diet, sedentary lifestyle and family history of DM2.  - encourage dietary modification and aerobic exercise  Diet: HH  DVT ppx: SCDs  Code Status: Full code  Dispo: Anticipated discharge home today  Sarina Ser, Medical Student 03/10/2018, 10:21 AM Pager: [phone]  Attestation for Student Documentation:  I personally was present and performed or re-performed the history, physical exam and medical decision-making activities of this service and have verified that the service and findings are accurately documented in the student's note.  Shela Leff, MD 03/15/2018, 7:28 AM

## 2018-03-10 NOTE — Progress Notes (Signed)
Discharge  Pt was able to participate with discharge teaching with spouse at the bedside. PIV removed and pain score was 1/10. Follow-up appts and prescriptions explained to pt and she was directed when to take them and where to pick them up from. All pt's questions answered to her satisfaction.

## 2018-03-14 LAB — ALDOSTERONE + RENIN ACTIVITY W/ RATIO: Aldosterone: 3.2 ng/dL (ref 0.0–30.0)

## 2018-03-15 ENCOUNTER — Telehealth: Payer: Self-pay | Admitting: *Deleted

## 2018-03-15 MED ORDER — POLYSACCHARIDE IRON COMPLEX 150 MG PO CAPS: 150.0000 mg | ORAL_CAPSULE | Freq: Every day | ORAL | 3 refills | Status: DC

## 2018-03-15 NOTE — Telephone Encounter (Signed)
Fax from [facility]:  [id number] CAP  NOTES TO PRESCRIBER: CAN WE CHANGE TO [facility]? Patient was seen inpatient 03/10/18 Has [facility] appt 03/22/18

## 2018-03-15 NOTE — Telephone Encounter (Signed)
Yes. Ferrex 150mg  daily is fine.

## 2018-03-15 NOTE — Telephone Encounter (Signed)
Confirmed with [facility] Pharmacist that new Rx for requested ferrex was received. Hubbard Hartshorn, RN, BSN

## 2018-03-22 ENCOUNTER — Other Ambulatory Visit: Payer: Self-pay

## 2018-03-22 ENCOUNTER — Ambulatory Visit (INDEPENDENT_AMBULATORY_CARE_PROVIDER_SITE_OTHER): Payer: Federal, State, Local not specified - PPO | Admitting: Internal Medicine

## 2018-03-22 ENCOUNTER — Encounter: Payer: Self-pay | Admitting: Student in an Organized Health Care Education/Training Program

## 2018-03-22 VITALS — BP 161/80 | HR 82 | Temp 97.8°F | Ht 67.0 in | Wt 190.0 lb

## 2018-03-22 DIAGNOSIS — K219 Gastro-esophageal reflux disease without esophagitis: Secondary | ICD-10-CM | POA: Diagnosis not present

## 2018-03-22 DIAGNOSIS — Z79899 Other long term (current) drug therapy: Secondary | ICD-10-CM | POA: Diagnosis not present

## 2018-03-22 DIAGNOSIS — E876 Hypokalemia: Secondary | ICD-10-CM | POA: Diagnosis not present

## 2018-03-22 DIAGNOSIS — J302 Other seasonal allergic rhinitis: Secondary | ICD-10-CM | POA: Insufficient documentation

## 2018-03-22 DIAGNOSIS — D509 Iron deficiency anemia, unspecified: Secondary | ICD-10-CM

## 2018-03-22 DIAGNOSIS — R011 Cardiac murmur, unspecified: Secondary | ICD-10-CM | POA: Diagnosis not present

## 2018-03-22 DIAGNOSIS — Z9889 Other specified postprocedural states: Secondary | ICD-10-CM

## 2018-03-22 DIAGNOSIS — I10 Essential (primary) hypertension: Secondary | ICD-10-CM | POA: Diagnosis not present

## 2018-03-22 MED ORDER — AMLODIPINE BESYLATE 10 MG PO TABS: 10.0000 mg | ORAL_TABLET | Freq: Every day | ORAL | 0 refills | Status: DC

## 2018-03-22 MED ORDER — LISINOPRIL 20 MG PO TABS: 20.0000 mg | ORAL_TABLET | Freq: Every day | ORAL | 0 refills | Status: DC

## 2018-03-22 MED ORDER — FLUTICASONE PROPIONATE 50 MCG/ACT NA SUSP: 2.0000 | Freq: Every day | NASAL | 0 refills | Status: DC

## 2018-03-22 NOTE — Assessment & Plan Note (Addendum)
Assessment Recently admitted for symptomatic anemia. Started taking oral iron 2 days ago. Hb 10.7 on discharge. She reports improvement in her sensation of dizziness. No active bleeding. Will check for celiac disease, as well as re-check Hb.  Plan - Continue ferrex 150mg  daily - CBC today - Anti-TTG Ab and total IgA ordered - Continue f/u with GI - patient reports EGD/colonoscopy scheduled for June - F/u in IMTS clinic in 1 month  ADDENDUM: Hb stable at 10.7. Anti-TTG Ab and total IgA normal. Spoke to patient over phone, she states she does have EGD/colonoscopy appointment scheduled.

## 2018-03-22 NOTE — Assessment & Plan Note (Addendum)
Assessment Reports previous use of zyrtec for seasonal allergies, however she has not been using Zyrtec recently due to the multiple changes in her medications. She does endorse productive cough that has worsened recently, however it is unclear whether this is related to her allergies vs lisinopril. She denies SOB. She also endorses sinus drainage, nasal congestion, and watery eyes. Lung exam is normal today. Vitals stable. Will treat seasonal allergies and re-assess cough/symptoms at next visit.  Plan - Flonase 2 sprays daily

## 2018-03-22 NOTE — Patient Instructions (Addendum)
FOLLOW-UP INSTRUCTIONS When: 1 month For: BP follow-up What to bring: medications   Kayla Williams,  It was a pleasure to meet you today.  We are going to increase your blood pressure medicines today. - Please increase to lisinopril 20mg  once a day - Please increase to amlodipine 10mg  once a day  We are checking some bloodwork today - I will let you know the results when I get them back.  Please make sure to follow up with the stomach doctors for the endoscopy and colonoscopy as scheduled.

## 2018-03-22 NOTE — Assessment & Plan Note (Addendum)
K 3.1 on discharge - Check BMP today - Continue PO K 42mEq daily  ADDENDUM: K 4.1. Called patient to have her stop taking PO K supplementation.

## 2018-03-22 NOTE — Progress Notes (Signed)
   CC: follow-up of anemia and HTN  HPI:  [name] is a 67 y.o. female with PMH of anemia, GERD, and HTN who presents for hospital follow-up of anemia and HTN and to establish care. She has not had a PCP in many years.  She was recently admitted to IMTS service 4/10-4/12 for symptomatic iron deficiency anemia and hypertensive urgency. Hb 7.1 on admission. She required blood transfusions was discharged on oral iron and EGD/colonoscopy scheduled for 4/23. She was also discharged on lisinopril, amlodipine, and oral potassium. Hb 10.7, K 3.1 on discharge.  Anemia: She did have some trouble obtaining oral iron due to insurance issues.  She did finally receive oral iron pill and started taking this about 2 days ago. She denies hematemesis, melena, or hematuria. She is postmenopausal and has not had any spotting.  She did see GI today.  She states that they are completely booked in May for endoscopy, so she was set up for EGD and colonoscopy in June. No family history of colon cancer.  Hypertension, HA: Since discharge, she continues to have some dizziness, however states that it is better than when she first presented to the hospital.  She also has been having a dull headache on the top of her head.  It does not wake her from sleep.  It occurs mostly when she is trying to do too many things.  She does endorse some nausea.  She denies vision changes, emesis, fevers, photophobia, or phonophobia.  She reports that Tylenol is helpful.  Blood pressure significantly elevated today to 161/80.  Repeat 192/88.  She reports compliance with lisinopril and amlodipine.  She denies dysuria, chest pain, shortness of breath.  Past Medical History:  Diagnosis Date  . Anemia   . 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 GERD (gastroesophageal reflux disease)   . History of blood transfusion 03/09/2018   "low HgB"  . Hypertension    Review of Systems:   GEN: Negative for  fevers EYES: Positive for left watery discharge CV: Negative for chest pain PULM: Negative for shortness of breath ABD: Negative for abdominal pain. Negative for hematemesis, hematochezia, or melena. Does endorse dark stools while on iron GU: Negative for hematuria or dysuria NEURO: Positive for headaches and intermittent dizziness  Physical Exam:  Vitals:   03/22/18 1403  BP: (!) 161/80  Pulse: 82  Temp: 97.8 F (36.6 C)  TempSrc: Oral  SpO2: 100%  Weight: 190 lb (86.2 kg)  Height: 5\' 7"  (1.702 m)   GEN: Sitting in chair in NAD HENT: No sinus tenderness. No erythema of posterior pharynx. MMM NEURO: CN II-XII intact. 5/5 grip strength in BUE and 5/5 hip flexor strength in BLE. Normal sensation in all extremities. CV: NR & RR, systolic murmur best heard at RUSB, no r/g PULM: CTAB, no wheezes or rales ABD: Soft, NT, ND, +BS MSK: No LE edema  Assessment & Plan:   See Encounters Tab for problem based charting.  Patient discussed with Dr. Evette Doffing

## 2018-03-22 NOTE — Assessment & Plan Note (Addendum)
Assessment BP persistently elevated to 161/80, repeat 192/88. She reports compliance with discharge medications of lisinopril 10mg  daily and amlodipine 5mg  daily. Cr slightly elevated on discharge to 1.18. Headaches most likely related to persistently elevated BP. Will recheck labs today and increase BP medications  She does endorse a worsened productive cough since starting lisinopril, however she has concurrent allergic rhinitis symptoms. Thus, we will treat allergic rhinitis first and re-evaluate patient's cough - if persistent, may need to discontinue lisinopril.  Plan - Increase to lisinopril 20mg  daily - Increase to amlodipine 10mg  daily - Check BMP today - F/u in 1 month - Repeat BMP at next visit  ADDENDUM: K normal at 4.1 - spoke to patient over phone to ask her to stop oral K supplementation. She agreed.  She told me over the phone that when she went to sleep last night after increasing the dose of her anti-hypertensives, she could feel her heart racing, which she had not felt on her previous dose of medication. She stated that she did take the increased dose of her BP medications this morning and is feeling well so far today. Advised her to continue current doses of medications and to monitor her symptoms closely. If she continues to feel bad on increased dose, advised her to call our clinic to schedule an [place] visit or to present to ER if she feels it is an emergency. She expressed understanding.

## 2018-03-23 LAB — IGA: IgA/Immunoglobulin A, Serum: 258 mg/dL (ref 87–352)

## 2018-03-23 LAB — TISSUE TRANSGLUTAMINASE ABS,IGG,IGA
Tissue Transglut Ab: 3 U/mL (ref 0–5)
Transglutaminase IgA: <2 U/mL (ref 0–3)

## 2018-03-23 LAB — CBC
HEMATOCRIT: 35.3 % (ref 34.0–46.6)
Hemoglobin: 10.7 g/dL — ABNORMAL LOW (ref 11.1–15.9)
MCH: 22.4 pg — ABNORMAL LOW (ref 26.6–33.0)
MCHC: 30.3 g/dL — AB (ref 31.5–35.7)
MCV: 74 fL — AB (ref 79–97)
PLATELETS: 414 10*3/uL — AB (ref 150–379)
RBC: 4.77 x10E6/uL (ref 3.77–5.28)
RDW: 27.3 % — AB (ref 12.3–15.4)
WBC: 5.8 10*3/uL (ref 3.4–10.8)

## 2018-03-23 LAB — BMP8+ANION GAP
Anion Gap: 13 mmol/L (ref 10.0–18.0)
BUN / CREAT RATIO: 16 (ref 12–28)
BUN: 17 mg/dL (ref 8–27)
CO2: 23 mmol/L (ref 20–29)
CREATININE: 1.08 mg/dL — AB (ref 0.57–1.00)
Calcium: 9.8 mg/dL (ref 8.7–10.3)
Chloride: 102 mmol/L (ref 96–106)
GFR calc Af Amer: 62 mL/min/{1.73_m2} (ref >59)
GFR, EST NON AFRICAN AMERICAN: 54 mL/min/{1.73_m2} — AB (ref >59)
Glucose: 82 mg/dL (ref 65–99)
Potassium: 4.1 mmol/L (ref 3.5–5.2)
SODIUM: 138 mmol/L (ref 134–144)

## 2018-03-23 NOTE — Progress Notes (Signed)
Internal Medicine Clinic Attending  Case discussed with Dr. Huang at the time of the visit.  We reviewed the resident's history and exam and pertinent patient test results.  I agree with the assessment, diagnosis, and plan of care documented in the resident's note. 

## 2018-03-24 NOTE — Addendum Note (Signed)
Addended by: Oretha Caprice on: 03/24/2018 10:06 AM   Modules accepted: Orders

## 2018-03-29 ENCOUNTER — Encounter: Payer: Self-pay | Admitting: Internal Medicine

## 2018-04-19 ENCOUNTER — Ambulatory Visit (INDEPENDENT_AMBULATORY_CARE_PROVIDER_SITE_OTHER): Payer: Federal, State, Local not specified - PPO | Admitting: Internal Medicine

## 2018-04-19 ENCOUNTER — Encounter: Payer: Self-pay | Admitting: Internal Medicine

## 2018-04-19 VITALS — BP 160/72 | HR 79 | Temp 98.2°F | Wt 201.8 lb

## 2018-04-19 DIAGNOSIS — J302 Other seasonal allergic rhinitis: Secondary | ICD-10-CM

## 2018-04-19 DIAGNOSIS — I10 Essential (primary) hypertension: Secondary | ICD-10-CM | POA: Diagnosis not present

## 2018-04-19 DIAGNOSIS — Z79899 Other long term (current) drug therapy: Secondary | ICD-10-CM

## 2018-04-19 DIAGNOSIS — D509 Iron deficiency anemia, unspecified: Secondary | ICD-10-CM | POA: Diagnosis not present

## 2018-04-19 DIAGNOSIS — D508 Other iron deficiency anemias: Secondary | ICD-10-CM

## 2018-04-19 MED ORDER — CETIRIZINE HCL 10 MG PO CAPS: 10.0000 mg | ORAL_CAPSULE | Freq: Every day | ORAL | 0 refills | Status: DC | PRN

## 2018-04-19 MED ORDER — LISINOPRIL 40 MG PO TABS: 40.0000 mg | ORAL_TABLET | Freq: Every day | ORAL | 0 refills | Status: DC

## 2018-04-19 NOTE — Progress Notes (Signed)
   CC: follow up of HTN  HPI:  [name] is a 67 y.o.  Female with PMH below.  She is here for a follow up of her HTN.    Please see A&P for status of the patient's chronic medical conditions  Past Medical History:  Diagnosis Date  . Anemia   . 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 GERD (gastroesophageal reflux disease)   . History of blood transfusion 03/09/2018   "low HgB"  . Hypertension    Review of Systems:  ROS: Pulmonary: pt denies increased work of breathing, shortness of breath,  Cardiac: pt denies palpitations, chest pain,  Abdominal: pt denies abdominal pain, nausea, vomiting, or diarrhea  Physical Exam:  Vitals:   04/19/18 1105  BP: (!) 160/72  Pulse: 79  Temp: 98.2 F (36.8 C)  TempSrc: Oral  SpO2: 100%  Weight: 201 lb 12.8 oz (91.5 kg)   Physical Exam  Constitutional: No distress.  HENT:  Nose: Mucosal edema and rhinorrhea present.  Eyes: Right eye exhibits discharge (tearing). Left eye exhibits discharge (tearing).  Cardiovascular: Normal rate, regular rhythm and normal heart sounds. Exam reveals no gallop and no friction rub.  No murmur heard. Pulmonary/Chest: Effort normal and breath sounds normal. No respiratory distress. She has no wheezes. She has no rales. She exhibits no tenderness.  Abdominal: Soft. Bowel sounds are normal. She exhibits no distension and no mass. There is no tenderness. There is no rebound and no guarding.  Neurological: She is alert.  Skin: She is not diaphoretic.    Social History   Socioeconomic History  . Marital status: Widowed    Spouse name: Not on file  . Number of children: Not on file  . Years of education: Not on file  . Highest education level: Not on file  Occupational History  . Not on file  Social Needs  . Financial resource strain: Not on file  . Food insecurity:    Worry: Not on file    Inability: Not on file  . Transportation needs:    Medical: Not on  file    Non-medical: Not on file  Tobacco Use  . Smoking status: Never Smoker  . Smokeless tobacco: Never Used  Substance and Sexual Activity  . Alcohol use: Not Currently  . Drug use: Never  . Sexual activity: Yes  Lifestyle  . Physical activity:    Days per week: Not on file    Minutes per session: Not on file  . Stress: Not on file  Relationships  . Social connections:    Talks on phone: Not on file    Gets together: Not on file    Attends religious service: Not on file    Active member of club or organization: Not on file    Attends meetings of clubs or organizations: Not on file    Relationship status: Not on file  . Intimate partner violence:    Fear of current or ex partner: Not on file    Emotionally abused: Not on file    Physically abused: Not on file    Forced sexual activity: Not on file  Other Topics Concern  . Not on file  Social History Narrative  . Not on file    No family history on file.  Assessment & Plan:   See Encounters Tab for problem based charting.  Patient discussed with Dr. Lynnae January

## 2018-04-19 NOTE — Assessment & Plan Note (Signed)
Pt with tearing, post nasal drip associated with cough and rhinorrhea.  She is still concerned that lisinopril may be causing this.  Talking with the patient her cough began before the lisinopril and seemed to coincide with the warmer weather and pollen.  Her symptoms and exam were more indicative of allergies.    -continue flonase -will write prescription for zyrtec 10mg  daily.

## 2018-04-19 NOTE — Patient Instructions (Addendum)
[name], we will increase your dose of lisinopril to 40mg  daily.  I have also written for an allergy medication called zyrtec to help with your watery eyes and nose/congestion.  Please return in about 3 weeks so we can recheck your blood pressure and labs.

## 2018-04-20 LAB — BMP8+ANION GAP
ANION GAP: 15 mmol/L (ref 10.0–18.0)
BUN/Creatinine Ratio: 14 (ref 12–28)
BUN: 14 mg/dL (ref 8–27)
CO2: 23 mmol/L (ref 20–29)
CREATININE: 0.98 mg/dL (ref 0.57–1.00)
Calcium: 9.7 mg/dL (ref 8.7–10.3)
Chloride: 103 mmol/L (ref 96–106)
GFR calc Af Amer: 70 mL/min/{1.73_m2} (ref >59)
GFR calc non Af Amer: 60 mL/min/{1.73_m2} (ref >59)
GLUCOSE: 86 mg/dL (ref 65–99)
Potassium: 3.6 mmol/L (ref 3.5–5.2)
Sodium: 141 mmol/L (ref 134–144)

## 2018-04-20 LAB — CBC
HEMATOCRIT: 36.9 % (ref 34.0–46.6)
Hemoglobin: 11.3 g/dL (ref 11.1–15.9)
MCH: 23.8 pg — AB (ref 26.6–33.0)
MCHC: 30.6 g/dL — ABNORMAL LOW (ref 31.5–35.7)
MCV: 78 fL — AB (ref 79–97)
Platelets: 320 10*3/uL (ref 150–450)
RBC: 4.75 x10E6/uL (ref 3.77–5.28)
RDW: 26.1 % — AB (ref 12.3–15.4)
WBC: 5.1 10*3/uL (ref 3.4–10.8)

## 2018-04-20 NOTE — Assessment & Plan Note (Signed)
BP Readings from Last 3 Encounters:  04/19/18 (!) 160/72  03/22/18 (!) 161/80  03/10/18 (!) 169/95   BP elevated today, repeated in office and remained elevated after pt rested adequately.  She is taking her amlodipine 10mg  and lisinopril 20mg  as prescribed.  -will recheck labs today -will increase lisnopril to 40 mg and have pt follow up for recheck and labs.

## 2018-04-20 NOTE — Assessment & Plan Note (Addendum)
On iron therapy, GI appt for endoscopy/colonoscopy in early June.  No noticeable blood loss, no anemia symptoms.  -will check cbc today with other labs to continue to monitor  Addendum Hgb and MCW with improvement

## 2018-04-26 NOTE — Progress Notes (Signed)
Internal Medicine Clinic Attending  Case discussed with Dr. Winfrey  at the time of the visit.  We reviewed the resident's history and exam and pertinent patient test results.  I agree with the assessment, diagnosis, and plan of care documented in the resident's note.  

## 2018-04-28 ENCOUNTER — Telehealth: Payer: Self-pay | Admitting: Internal Medicine

## 2018-04-28 NOTE — Telephone Encounter (Signed)
Pt would like a call back to discuss her previous FMLA forms that were completed.

## 2018-05-17 DIAGNOSIS — D126 Benign neoplasm of colon, unspecified: Secondary | ICD-10-CM | POA: Diagnosis not present

## 2018-05-17 DIAGNOSIS — K449 Diaphragmatic hernia without obstruction or gangrene: Secondary | ICD-10-CM | POA: Diagnosis not present

## 2018-05-17 DIAGNOSIS — D509 Iron deficiency anemia, unspecified: Secondary | ICD-10-CM | POA: Diagnosis not present

## 2018-05-17 DIAGNOSIS — K635 Polyp of colon: Secondary | ICD-10-CM | POA: Diagnosis not present

## 2018-05-17 DIAGNOSIS — K3189 Other diseases of stomach and duodenum: Secondary | ICD-10-CM | POA: Diagnosis not present

## 2018-05-18 ENCOUNTER — Ambulatory Visit: Payer: Federal, State, Local not specified - PPO

## 2018-05-19 ENCOUNTER — Ambulatory Visit: Payer: Federal, State, Local not specified - PPO | Admitting: Pharmacist

## 2018-05-19 ENCOUNTER — Encounter: Payer: Self-pay | Admitting: Internal Medicine

## 2018-05-19 ENCOUNTER — Other Ambulatory Visit: Payer: Self-pay

## 2018-05-19 ENCOUNTER — Ambulatory Visit (INDEPENDENT_AMBULATORY_CARE_PROVIDER_SITE_OTHER): Payer: Federal, State, Local not specified - PPO | Admitting: Internal Medicine

## 2018-05-19 VITALS — BP 154/74 | HR 74 | Temp 98.3°F | Ht 67.0 in | Wt 199.2 lb

## 2018-05-19 DIAGNOSIS — I10 Essential (primary) hypertension: Secondary | ICD-10-CM

## 2018-05-19 DIAGNOSIS — J302 Other seasonal allergic rhinitis: Secondary | ICD-10-CM | POA: Diagnosis not present

## 2018-05-19 DIAGNOSIS — Z79899 Other long term (current) drug therapy: Secondary | ICD-10-CM | POA: Diagnosis not present

## 2018-05-19 MED ORDER — LOSARTAN POTASSIUM-HCTZ 100-25 MG PO TABS: 1.0000 | ORAL_TABLET | Freq: Every day | ORAL | 0 refills | Status: DC

## 2018-05-19 MED ORDER — OLMESARTAN-AMLODIPINE-HCTZ 40-10-25 MG PO TABS: 1.0000 | ORAL_TABLET | Freq: Every day | ORAL | 0 refills | Status: DC

## 2018-05-19 NOTE — Progress Notes (Signed)
Patient was seen today in a co-visit between the physician and pharmacist. Confirmed Tribenzor is covered per [employer] for $5 copay.  See documentation under Dr. Shanna Cisco visit for details.

## 2018-05-19 NOTE — Patient Instructions (Signed)
Kayla Williams,  Put you on a new blood pressure medicine and stop the lisinopril.  This will be a new combination medicine of losartan and hydrochlorothiazide.  If you have any problems getting the medication is too expensive please give [place] a call and we will get it worked out for you.  Please come back to see [place] in 6 to 8 weeks for follow-up.

## 2018-05-19 NOTE — Progress Notes (Signed)
   CC: HTN follow up  HPI:  [name] is a 67 y.o. female with a past medical history listed below here today for follow up of her hypertension.  For details of today's visit and the status of her chronic medical issues please refer to the assessment and plan.   Past Medical History:  Diagnosis Date  . Anemia   . 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 GERD (gastroesophageal reflux disease)   . History of blood transfusion 03/09/2018   "low HgB"  . Hypertension    Review of Systems:   No chest pain or shortness of breath  Physical Exam:  Vitals:   05/19/18 1442  BP: (!) 154/74  Pulse: 74  Temp: 98.3 F (36.8 C)  TempSrc: Oral  SpO2: 100%  Weight: 199 lb 3.2 oz (90.4 kg)  Height: 5\' 7"  (1.702 m)   GENERAL- alert, co-operative, appears as stated age, not in any distress. CARDIAC- RRR, no murmurs, rubs or gallops. RESP- Moving equal volumes of air, and clear to auscultation bilaterally, no wheezes or crackles. ABDOMEN- Soft, nontender, bowel sounds present. EXTREMITIES- pulse 2+, symmetric, no pedal edema. SKIN- Warm, dry, No rash or lesion. PSYCH- Normal mood and affect, appropriate thought content and speech.  Assessment & Plan:   See Encounters Tab for problem based charting.  Patient discussed with Dr. Beryle Beams

## 2018-05-20 DIAGNOSIS — K635 Polyp of colon: Secondary | ICD-10-CM | POA: Diagnosis not present

## 2018-05-20 DIAGNOSIS — D126 Benign neoplasm of colon, unspecified: Secondary | ICD-10-CM | POA: Diagnosis not present

## 2018-05-20 LAB — BMP8+ANION GAP
Anion Gap: 14 mmol/L (ref 10.0–18.0)
BUN/Creatinine Ratio: 13 (ref 12–28)
BUN: 17 mg/dL (ref 8–27)
CALCIUM: 9.6 mg/dL (ref 8.7–10.3)
CHLORIDE: 104 mmol/L (ref 96–106)
CO2: 24 mmol/L (ref 20–29)
Creatinine, Ser: 1.27 mg/dL — ABNORMAL HIGH (ref 0.57–1.00)
GFR calc non Af Amer: 44 mL/min/{1.73_m2} — ABNORMAL LOW (ref >59)
GFR, EST AFRICAN AMERICAN: 51 mL/min/{1.73_m2} — AB (ref >59)
Glucose: 71 mg/dL (ref 65–99)
Potassium: 3.7 mmol/L (ref 3.5–5.2)
SODIUM: 142 mmol/L (ref 134–144)

## 2018-05-20 NOTE — Assessment & Plan Note (Signed)
BP Readings from Last 3 Encounters:  05/19/18 (!) 154/74  04/19/18 (!) 160/72  03/22/18 (!) 161/80    Lab Results  Component Value Date   NA 142 05/19/2018   K 3.7 05/19/2018   CREATININE 1.27 (H) 05/19/2018   She returns today for follow-up of her hypertension.  Her blood pressure is mildly improved from previous at 154/74.  She was last seen on 5/21 and her lisinopril 20 mg was increased to 40 mg daily at that time and she is continued on amlodipine 10 mg.  She reports to me that ever since she was started on lisinopril 2 months ago she has had a chronic nonproductive cough.  She reports that she has a tingling sensation in her throat.  She does note that she has seasonal allergies that does cause her some cough but this is not her usual cough.  Reports compliance with any other adverse side effects including no chest pain, shortness of breath, palpitations, lightheadedness or dizziness.  Assessment and plan:  I will stop her lisinopril due to side effects of cough.  Combination olmesartan-amlodipine-hydrochlorothiazide 40-10-25 mg is available for a $5 co-pay at her pharmacy.  Her blood pressure remains elevated I think triple therapy would be necessary for her.  I will send in this medication for her.  Recheck BMP today.  Her creatinine today is mildly elevated from her baseline.  She is previously fluctuated from around 1-1.8.  Today she is 1.27.  This may reflect a small bump after increasing the dose of her lisinopril.  No electrolyte abnormalities.  I will have her come back in 4 to 6 weeks for blood pressure recheck and repeat labs.

## 2018-05-20 NOTE — Progress Notes (Signed)
Medicine attending: Medical history, presenting problems, physical findings, and medications, reviewed with resident physician Dr Nathan Boswell on the day of the patient visit and I concur with his evaluation and management plan. 

## 2018-06-16 ENCOUNTER — Other Ambulatory Visit: Payer: Self-pay | Admitting: Internal Medicine

## 2018-06-16 DIAGNOSIS — I10 Essential (primary) hypertension: Secondary | ICD-10-CM

## 2018-06-16 NOTE — Telephone Encounter (Signed)
Pt is checking on status, pls contact# [phone]

## 2018-06-16 NOTE — Telephone Encounter (Signed)
Next appt scheduled 7/29 with PCP. 

## 2018-06-16 NOTE — Telephone Encounter (Signed)
Will give one month supply, has appointment coming up will recheck labs from then on will do 90 day supplies with refills if creatinine normalized.

## 2018-06-27 ENCOUNTER — Ambulatory Visit: Payer: Federal, State, Local not specified - PPO | Admitting: Internal Medicine

## 2018-06-27 ENCOUNTER — Encounter: Payer: Self-pay | Admitting: Internal Medicine

## 2018-06-27 ENCOUNTER — Other Ambulatory Visit: Payer: Self-pay

## 2018-06-27 VITALS — BP 131/70 | HR 70 | Temp 98.2°F | Ht 67.0 in | Wt 199.2 lb

## 2018-06-27 DIAGNOSIS — I10 Essential (primary) hypertension: Secondary | ICD-10-CM

## 2018-06-27 DIAGNOSIS — Z0000 Encounter for general adult medical examination without abnormal findings: Secondary | ICD-10-CM

## 2018-06-27 DIAGNOSIS — E785 Hyperlipidemia, unspecified: Secondary | ICD-10-CM

## 2018-06-27 DIAGNOSIS — E876 Hypokalemia: Secondary | ICD-10-CM | POA: Diagnosis not present

## 2018-06-27 DIAGNOSIS — Z23 Encounter for immunization: Secondary | ICD-10-CM

## 2018-06-27 DIAGNOSIS — D509 Iron deficiency anemia, unspecified: Secondary | ICD-10-CM | POA: Diagnosis not present

## 2018-06-27 DIAGNOSIS — E7800 Pure hypercholesterolemia, unspecified: Secondary | ICD-10-CM

## 2018-06-27 DIAGNOSIS — Z9289 Personal history of other medical treatment: Secondary | ICD-10-CM | POA: Diagnosis not present

## 2018-06-27 DIAGNOSIS — E2839 Other primary ovarian failure: Secondary | ICD-10-CM

## 2018-06-27 DIAGNOSIS — E669 Obesity, unspecified: Secondary | ICD-10-CM

## 2018-06-27 DIAGNOSIS — Z1239 Encounter for other screening for malignant neoplasm of breast: Secondary | ICD-10-CM

## 2018-06-27 DIAGNOSIS — Z8719 Personal history of other diseases of the digestive system: Secondary | ICD-10-CM

## 2018-06-27 DIAGNOSIS — Z6831 Body mass index (BMI) 31.0-31.9, adult: Secondary | ICD-10-CM

## 2018-06-27 DIAGNOSIS — Z79899 Other long term (current) drug therapy: Secondary | ICD-10-CM

## 2018-06-27 NOTE — Progress Notes (Signed)
   CC: HTN, iron deficiency anemia follow up, health maintenance  HPI:  [name] is a 67 y.o. female with PMH below.  She is here for a HTN, iron deficiency anemia follow up and to address her health maintenance.    Please see A&P for status of the patient's chronic medical conditions  Past Medical History:  Diagnosis Date  . Anemia   . 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 GERD (gastroesophageal reflux disease)   . History of blood transfusion 03/09/2018   "low HgB"  . Hypertension    Review of Systems:  ROS: Pulmonary: pt denies increased work of breathing, shortness of breath,  Cardiac: pt denies palpitations, chest pain,  Abdominal: pt denies abdominal pain, nausea, vomiting, or diarrhea  Physical Exam:  Vitals:   06/27/18 1412  BP: 131/70  Pulse: 70  Temp: 98.2 F (36.8 C)  TempSrc: Oral  SpO2: 100%  Weight: 199 lb 3.2 oz (90.4 kg)  Height: 5\' 7"  (1.702 m)   Physical Exam  Constitutional: No distress.  Cardiovascular: Normal rate, regular rhythm and normal heart sounds. Exam reveals no gallop and no friction rub.  No murmur heard. Pulmonary/Chest: Effort normal and breath sounds normal. No respiratory distress. She has no wheezes. She has no rales. She exhibits no tenderness.  Abdominal: Soft. Bowel sounds are normal. She exhibits no distension and no mass. There is no tenderness. There is no rebound and no guarding.  Neurological: She is alert.  Skin: She is not diaphoretic.    Social History   Socioeconomic History  . Marital status: Widowed    Spouse name: Not on file  . Number of children: Not on file  . Years of education: Not on file  . Highest education level: Not on file  Occupational History  . Not on file  Social Needs  . Financial resource strain: Not on file  . Food insecurity:    Worry: Not on file    Inability: Not on file  . Transportation needs:    Medical: Not on file    Non-medical: Not  on file  Tobacco Use  . Smoking status: Never Smoker  . Smokeless tobacco: Never Used  Substance and Sexual Activity  . Alcohol use: Not Currently  . Drug use: Never  . Sexual activity: Yes  Lifestyle  . Physical activity:    Days per week: Not on file    Minutes per session: Not on file  . Stress: Not on file  Relationships  . Social connections:    Talks on phone: Not on file    Gets together: Not on file    Attends religious service: Not on file    Active member of club or organization: Not on file    Attends meetings of clubs or organizations: Not on file    Relationship status: Not on file  . Intimate partner violence:    Fear of current or ex partner: Not on file    Emotionally abused: Not on file    Physically abused: Not on file    Forced sexual activity: Not on file  Other Topics Concern  . Not on file  Social History Narrative  . Not on file    No family history on file.  Assessment & Plan:   See Encounters Tab for problem based charting.  Patient discussed with Dr. Daryll Drown

## 2018-06-27 NOTE — Patient Instructions (Signed)
Kayla Williams, we will check some lab work for you and I will call you with the results.  Keep up the good work with your blood pressure.  I will have you come back in about 6 months for a follow up visit.

## 2018-06-28 ENCOUNTER — Telehealth: Payer: Self-pay | Admitting: Internal Medicine

## 2018-06-28 DIAGNOSIS — Z0000 Encounter for general adult medical examination without abnormal findings: Secondary | ICD-10-CM | POA: Insufficient documentation

## 2018-06-28 DIAGNOSIS — I10 Essential (primary) hypertension: Secondary | ICD-10-CM

## 2018-06-28 DIAGNOSIS — E785 Hyperlipidemia, unspecified: Secondary | ICD-10-CM | POA: Insufficient documentation

## 2018-06-28 DIAGNOSIS — E7800 Pure hypercholesterolemia, unspecified: Secondary | ICD-10-CM

## 2018-06-28 LAB — BMP8+ANION GAP
Anion Gap: 18 mmol/L (ref 10.0–18.0)
BUN / CREAT RATIO: 16 (ref 12–28)
BUN: 21 mg/dL (ref 8–27)
CALCIUM: 9.3 mg/dL (ref 8.7–10.3)
CHLORIDE: 99 mmol/L (ref 96–106)
CO2: 26 mmol/L (ref 20–29)
Creatinine, Ser: 1.33 mg/dL — ABNORMAL HIGH (ref 0.57–1.00)
GFR calc Af Amer: 48 mL/min/{1.73_m2} — ABNORMAL LOW (ref >59)
GFR calc non Af Amer: 42 mL/min/{1.73_m2} — ABNORMAL LOW (ref >59)
GLUCOSE: 89 mg/dL (ref 65–99)
POTASSIUM: 3.3 mmol/L — AB (ref 3.5–5.2)
Sodium: 143 mmol/L (ref 134–144)

## 2018-06-28 LAB — HEPATITIS C ANTIBODY: Hep C Virus Ab: <0.1 s/co ratio (ref 0.0–0.9)

## 2018-06-28 LAB — CBC
HEMATOCRIT: 33.4 % — AB (ref 34.0–46.6)
Hemoglobin: 10.7 g/dL — ABNORMAL LOW (ref 11.1–15.9)
MCH: 25.6 pg — ABNORMAL LOW (ref 26.6–33.0)
MCHC: 32 g/dL (ref 31.5–35.7)
MCV: 80 fL (ref 79–97)
Platelets: 367 10*3/uL (ref 150–450)
RBC: 4.18 x10E6/uL (ref 3.77–5.28)
RDW: 15.7 % — ABNORMAL HIGH (ref 12.3–15.4)
WBC: 4.3 10*3/uL (ref 3.4–10.8)

## 2018-06-28 LAB — LIPID PANEL
CHOLESTEROL TOTAL: 228 mg/dL — AB (ref 100–199)
Chol/HDL Ratio: 5.6 ratio — ABNORMAL HIGH (ref 0.0–4.4)
HDL: 41 mg/dL (ref >39)
LDL Calculated: 160 mg/dL — ABNORMAL HIGH (ref 0–99)
Triglycerides: 136 mg/dL (ref 0–149)
VLDL CHOLESTEROL CAL: 27 mg/dL (ref 5–40)

## 2018-06-28 LAB — FERRITIN: Ferritin: 84 ng/mL (ref 15–150)

## 2018-06-28 MED ORDER — AMLODIPINE BESYLATE 10 MG PO TABS: 10.0000 mg | ORAL_TABLET | Freq: Every day | ORAL | 0 refills | Status: DC

## 2018-06-28 MED ORDER — LABETALOL HCL 100 MG PO TABS: 100.0000 mg | ORAL_TABLET | Freq: Two times a day (BID) | ORAL | 0 refills | Status: DC

## 2018-06-28 MED ORDER — ROSUVASTATIN CALCIUM 20 MG PO TABS: 20.0000 mg | ORAL_TABLET | Freq: Every day | ORAL | 0 refills | Status: DC

## 2018-06-28 NOTE — Assessment & Plan Note (Addendum)
BP Readings from Last 3 Encounters:  06/27/18 131/70  05/19/18 (!) 154/74  04/19/18 (!) 160/72   BMP Latest Ref Rng & Units 06/27/2018 05/19/2018 04/19/2018  Glucose 65 - 99 mg/dL 89 71 86  BUN 8 - 27 mg/dL 21 17 14   Creatinine 0.57 - 1.00 mg/dL 1.33(H) 1.27(H) 0.98  BUN/Creat Ratio 12 - 28 16 13 14   Sodium 134 - 144 mmol/L 143 142 141  Potassium 3.5 - 5.2 mmol/L 3.3(L) 3.7 3.6  Chloride 96 - 106 mmol/L 99 104 103  CO2 20 - 29 mmol/L 26 24 23   Calcium 8.7 - 10.3 mg/dL 9.3 9.6 9.7   Patient's blood pressure is well controlled today on triple therapy with olmesartan amlodipine and HCTZ.  At her second last visit her lisinopril 20 mg was increased to 40 mg daily with a subsequent rise in her creatinine.  After switch to triple therapy and from an ACE to an arb her creatinine remains elevated, she is also hypokalemic today.    -I will have to switch her therapy due to continued elevation in creatinine -Discontinue her triple therapy combo pill  -Continue amlodipine, will add labetalol 100mg  BID and repeat BMP in about 2 weeks if creatinine normalizes can consider slow introduction of ARB at lower dose.

## 2018-06-28 NOTE — Assessment & Plan Note (Addendum)
Will screen for hepatitis C today, she received both her Prevnar 13 and her Tdap.  Ordered referral for mammogram and bone density scan.  I will also check a lipid panel today and the patient's age and obesity and other risk factors such as her hypertension and postmenopausal status.

## 2018-06-28 NOTE — Telephone Encounter (Signed)
spoke to pt about continuing increase in creatine and need to switch bp therapy she will call for appt in 2 weeks to recheck labs.  Also went over starting rosuvastatin.

## 2018-06-28 NOTE — Assessment & Plan Note (Signed)
Patient thought to be iron deficient due to slow upper GI bleed that has resolved with omeprazole.  She has been taking iron for the last few months and reports good compliance.    -Recheck ferritin today which is improved from about 4 to now 100 -Still anemic today but MCV improving

## 2018-06-28 NOTE — Assessment & Plan Note (Signed)
Lab Results  Component Value Date   CHOL 228 (H) 06/27/2018   HDL 41 06/27/2018   LDLCALC 160 (H) 06/27/2018   TRIG 136 06/27/2018   CHOLHDL 5.6 (H) 06/27/2018   Checked a lipid panel today due to patient's age and risk factors hypertension, obesity, postmenopausal female.  Her LDL is found to be markedly elevated.  ASCVD risk today is 14.9%.    -will call patient and discuss starting rosuvastatin 20mg  in addition to her changes in bp therapy

## 2018-07-03 NOTE — Progress Notes (Signed)
Internal Medicine Clinic Attending  Case discussed with Dr. Winfrey  at the time of the visit.  We reviewed the resident's history and exam and pertinent patient test results.  I agree with the assessment, diagnosis, and plan of care documented in the resident's note.  

## 2018-07-03 NOTE — Addendum Note (Signed)
Addended by: Gilles Chiquito B on: 07/03/2018 08:15 AM   Modules accepted: Level of Service

## 2018-07-25 ENCOUNTER — Other Ambulatory Visit: Payer: Self-pay

## 2018-07-25 ENCOUNTER — Ambulatory Visit (INDEPENDENT_AMBULATORY_CARE_PROVIDER_SITE_OTHER): Payer: Federal, State, Local not specified - PPO | Admitting: Internal Medicine

## 2018-07-25 ENCOUNTER — Encounter: Payer: Self-pay | Admitting: Internal Medicine

## 2018-07-25 VITALS — BP 143/66 | HR 60 | Temp 98.7°F | Ht 67.0 in | Wt 203.0 lb

## 2018-07-25 DIAGNOSIS — Z23 Encounter for immunization: Secondary | ICD-10-CM

## 2018-07-25 DIAGNOSIS — I10 Essential (primary) hypertension: Secondary | ICD-10-CM

## 2018-07-25 DIAGNOSIS — D509 Iron deficiency anemia, unspecified: Secondary | ICD-10-CM

## 2018-07-25 DIAGNOSIS — Z79899 Other long term (current) drug therapy: Secondary | ICD-10-CM | POA: Diagnosis not present

## 2018-07-25 MED ORDER — POLYSACCHARIDE IRON COMPLEX 150 MG PO CAPS: 150.0000 mg | ORAL_CAPSULE | Freq: Every day | ORAL | 3 refills | Status: DC

## 2018-07-25 MED ORDER — LABETALOL HCL 200 MG PO TABS: 200.0000 mg | ORAL_TABLET | Freq: Two times a day (BID) | ORAL | 2 refills | Status: DC

## 2018-07-25 MED ORDER — AMLODIPINE BESYLATE 10 MG PO TABS: 10.0000 mg | ORAL_TABLET | Freq: Every day | ORAL | 5 refills | Status: DC

## 2018-07-25 NOTE — Progress Notes (Signed)
   CC: follow up on HTN and address her health maintenance  HPI:  Ms.Shanoah Schissler is a 67 y.o. female with PMH below.  She is here today to follow up on HTN and address her health maintenance  Please see A&P for status of the patient's chronic medical conditions  Past Medical History:  Diagnosis Date  . Anemia   . 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 GERD (gastroesophageal reflux disease)   . History of blood transfusion 03/09/2018   "low HgB"  . Hypertension    Review of Systems:  ROS: Pulmonary: pt denies increased work of breathing, shortness of breath,  Cardiac: pt denies palpitations, chest pain,  Abdominal: pt denies abdominal pain, nausea, vomiting, or diarrhea  Physical Exam:  Vitals:   07/25/18 1422 07/25/18 1519  BP: (!) 149/81 (!) 143/66  Pulse: 72 60  Temp: 98.7 F (37.1 C)   TempSrc: Oral   SpO2: 98%   Weight: 203 lb (92.1 kg)   Height: 5\' 7"  (1.702 m)    Cardiac: normal rate and rhythm, clear s1 and s2 Pulmonary: CTAB, not in distress Abdominal: non distended abdomen, soft and nontender Extremities: trace LE edema Mental status: Alert, conversant, in good spirits  Social History   Socioeconomic History  . Marital status: Widowed    Spouse name: Not on file  . Number of children: Not on file  . Years of education: Not on file  . Highest education level: Not on file  Occupational History  . Not on file  Social Needs  . Financial resource strain: Not on file  . Food insecurity:    Worry: Not on file    Inability: Not on file  . Transportation needs:    Medical: Not on file    Non-medical: Not on file  Tobacco Use  . Smoking status: Never Smoker  . Smokeless tobacco: Never Used  Substance and Sexual Activity  . Alcohol use: Not Currently  . Drug use: Never  . Sexual activity: Yes  Lifestyle  . Physical activity:    Days per week: Not on file    Minutes per session: Not on file  . Stress: Not on  file  Relationships  . Social connections:    Talks on phone: Not on file    Gets together: Not on file    Attends religious service: Not on file    Active member of club or organization: Not on file    Attends meetings of clubs or organizations: Not on file    Relationship status: Not on file  . Intimate partner violence:    Fear of current or ex partner: Not on file    Emotionally abused: Not on file    Physically abused: Not on file    Forced sexual activity: Not on file  Other Topics Concern  . Not on file  Social History Narrative  . Not on file   No family history on file.  Assessment & Plan:   See Encounters Tab for problem based charting.  Patient discussed with Dr. Rebeca Alert

## 2018-07-25 NOTE — Patient Instructions (Addendum)
Kayla Williams, it is good to see you again.  Your blood pressure is still a little higher than we would like.  I will increase your labetalol from 100mg  twice daily to 200mg  twice daily.  Come back in one month and we will check your blood pressure and cholesterol.

## 2018-07-25 NOTE — Assessment & Plan Note (Addendum)
Slow upper gi bleed was worked up with egd and colonoscopy.  Has resolved with omeprazole.  Hgb, mcv now up trending with iron replacement.    -Check CBC today hemoglobin 11.1 MCV at 80

## 2018-07-26 ENCOUNTER — Encounter: Payer: Self-pay | Admitting: Internal Medicine

## 2018-07-26 ENCOUNTER — Telehealth: Payer: Self-pay | Admitting: Internal Medicine

## 2018-07-26 LAB — BMP8+ANION GAP
Anion Gap: 16 mmol/L (ref 10.0–18.0)
BUN / CREAT RATIO: 15 (ref 12–28)
BUN: 16 mg/dL (ref 8–27)
CALCIUM: 9.7 mg/dL (ref 8.7–10.3)
CHLORIDE: 103 mmol/L (ref 96–106)
CO2: 26 mmol/L (ref 20–29)
Creatinine, Ser: 1.04 mg/dL — ABNORMAL HIGH (ref 0.57–1.00)
GFR, EST AFRICAN AMERICAN: 65 mL/min/{1.73_m2} (ref >59)
GFR, EST NON AFRICAN AMERICAN: 56 mL/min/{1.73_m2} — AB (ref >59)
GLUCOSE: 78 mg/dL (ref 65–99)
POTASSIUM: 3.8 mmol/L (ref 3.5–5.2)
Sodium: 145 mmol/L — ABNORMAL HIGH (ref 134–144)

## 2018-07-26 LAB — CBC
HEMATOCRIT: 33.5 % — AB (ref 34.0–46.6)
Hemoglobin: 11.1 g/dL (ref 11.1–15.9)
MCH: 26.6 pg (ref 26.6–33.0)
MCHC: 33.1 g/dL (ref 31.5–35.7)
MCV: 80 fL (ref 79–97)
Platelets: 354 10*3/uL (ref 150–450)
RBC: 4.17 x10E6/uL (ref 3.77–5.28)
RDW: 15.5 % — AB (ref 12.3–15.4)
WBC: 5.6 10*3/uL (ref 3.4–10.8)

## 2018-07-26 NOTE — Telephone Encounter (Signed)
spoke with patient about return of normal renal function with medication changes and how her iron deficiency anemia is resolving with iron supplementation.

## 2018-07-26 NOTE — Assessment & Plan Note (Addendum)
BP Readings from Last 3 Encounters:  07/25/18 (!) 143/66  06/27/18 131/70  05/19/18 (!) 154/74   Patient tolerating new blood pressure medications well not report any side effects on labetalol 100 mg twice daily and amlodipine 10 mg daily.  She has very trace edema in her lower extremities likely from the amlodipine but this is not bothering her it is very minimal.  Since this medication switch her creatinine has returned to baseline.  She has no proteinuria on prior urinalysis, she is not diabetic and really has no approved indication for ACE or ARB other than hypertension.  -I will increase labetalol to 200 mg twice daily and continue amlodipine 10 mg daily

## 2018-08-04 NOTE — Progress Notes (Signed)
Internal Medicine Clinic Attending  Case discussed with Dr. Winfrey at the time of the visit.  We reviewed the resident's history and exam and pertinent patient test results.  I agree with the assessment, diagnosis, and plan of care documented in the resident's note.  Alexander Raines, M.D., Ph.D.  

## 2018-09-26 ENCOUNTER — Ambulatory Visit: Payer: Federal, State, Local not specified - PPO | Admitting: Internal Medicine

## 2018-09-26 ENCOUNTER — Other Ambulatory Visit: Payer: Self-pay

## 2018-09-26 ENCOUNTER — Encounter: Payer: Self-pay | Admitting: Internal Medicine

## 2018-09-26 VITALS — BP 206/92 | HR 62 | Temp 98.3°F | Ht 67.0 in | Wt 207.7 lb

## 2018-09-26 DIAGNOSIS — Z6832 Body mass index (BMI) 32.0-32.9, adult: Secondary | ICD-10-CM

## 2018-09-26 DIAGNOSIS — I10 Essential (primary) hypertension: Secondary | ICD-10-CM | POA: Diagnosis not present

## 2018-09-26 DIAGNOSIS — R400 Somnolence: Secondary | ICD-10-CM | POA: Diagnosis not present

## 2018-09-26 DIAGNOSIS — R0683 Snoring: Secondary | ICD-10-CM

## 2018-09-26 DIAGNOSIS — E669 Obesity, unspecified: Secondary | ICD-10-CM

## 2018-09-26 DIAGNOSIS — Z79899 Other long term (current) drug therapy: Secondary | ICD-10-CM

## 2018-09-26 MED ORDER — CHLORTHALIDONE 25 MG PO TABS
25.0000 mg | ORAL_TABLET | Freq: Every day | ORAL | 0 refills | Status: DC
Start: 2018-09-26 — End: 2019-09-07

## 2018-09-26 NOTE — Patient Instructions (Signed)
Kayla Williams your blood pressure was elevated today.  We will start you on a new medicine called chlorthalidone 25mg  tablets. Continue your amlodipine and labetalol as well.  We will have you back in about one week for a recheck.  Also I have ordered a sleep study, if you have sleep apnea this could be causing your high blood pressure.

## 2018-09-26 NOTE — Progress Notes (Signed)
   CC: severe asymptomatic hypertension, daytime somnolence  HPI:  [name] is a 67 y.o. female with PMH below.  Today we will address her  severe asymptomatic hypertension, daytime somnolence.  Please see A&P for status of the patient's chronic medical conditions  Past Medical History:  Diagnosis Date  . Anemia   . 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 GERD (gastroesophageal reflux disease)   . History of blood transfusion 03/09/2018   "low HgB"  . Hypertension    Review of Systems:  ROS: Pulmonary: pt denies increased work of breathing, shortness of breath,  Cardiac: pt denies palpitations, chest pain,  Abdominal: pt denies abdominal pain, nausea, vomiting, or diarrhea  Physical Exam:  Vitals:   09/26/18 1533 09/26/18 1555  BP: (!) 213/94 (!) 206/92  Pulse: 64 62  Temp: 98.3 F (36.8 C)   TempSrc: Oral   SpO2: 98%   Weight: 207 lb 11.2 oz (94.2 kg)   Height: 5\' 7"  (1.702 m)    Cardiac: normal rate and rhythm, clear s1 and s2 Pulmonary: CTAB, not in distress Abdominal: non distended abdomen, soft and nontender Extremities: no LE edema Psych: Alert, conversant, in good spirits   Social History   Socioeconomic History  . Marital status: Widowed    Spouse name: Not on file  . Number of children: Not on file  . Years of education: Not on file  . Highest education level: Not on file  Occupational History  . Not on file  Social Needs  . Financial resource strain: Not on file  . Food insecurity:    Worry: Not on file    Inability: Not on file  . Transportation needs:    Medical: Not on file    Non-medical: Not on file  Tobacco Use  . Smoking status: Never Smoker  . Smokeless tobacco: Never Used  Substance and Sexual Activity  . Alcohol use: Not Currently  . Drug use: Never  . Sexual activity: Yes  Lifestyle  . Physical activity:    Days per week: Not on file    Minutes per session: Not on file  . Stress:  Not on file  Relationships  . Social connections:    Talks on phone: Not on file    Gets together: Not on file    Attends religious service: Not on file    Active member of club or organization: Not on file    Attends meetings of clubs or organizations: Not on file    Relationship status: Not on file  . Intimate partner violence:    Fear of current or ex partner: Not on file    Emotionally abused: Not on file    Physically abused: Not on file    Forced sexual activity: Not on file  Other Topics Concern  . Not on file  Social History Narrative  . Not on file    History reviewed. No pertinent family history.  Assessment & Plan:   See Encounters Tab for problem based charting.  Patient discussed with Dr. Evette Doffing

## 2018-09-28 DIAGNOSIS — R400 Somnolence: Secondary | ICD-10-CM | POA: Insufficient documentation

## 2018-09-28 NOTE — Assessment & Plan Note (Signed)
Patient reports daytime somnolence.  She reports snoring loudly, has untinterrupted sleep, and is obese.  Today she also presents with severe hypertension although she discontinued her amlodipine for an unknown reason.   -Sleep study to evaluate for OSA

## 2018-09-28 NOTE — Assessment & Plan Note (Addendum)
Patient's blood pressure is severely elevated today.  She is completely asymptomatic. At our last visit she was close to goal.  We increased her labetalol dose.  Unfortunately after our last visit she stopped her amlodipine completely.  She was never instructed to do so, and I am not sure why she did this.  She has had poor tolerance to ACE's and ARB's with a significant bump in her creatinine above the threshold that I would expect and is considered safe.  This type of significant increase is associated with increased risk of future renal and cardiac disease.     -continue labetalol 200mg , amlodipine 10mg  -will add chlorthalidone 25mg  daily -follow up bp check in one week -also checking for OSA today

## 2018-09-28 NOTE — Progress Notes (Signed)
Internal Medicine Clinic Attending  Case discussed with Dr. Winfrey  at the time of the visit.  We reviewed the resident's history and exam and pertinent patient test results.  I agree with the assessment, diagnosis, and plan of care documented in the resident's note.  

## 2018-09-29 ENCOUNTER — Other Ambulatory Visit: Payer: Self-pay | Admitting: *Deleted

## 2018-09-29 DIAGNOSIS — E7800 Pure hypercholesterolemia, unspecified: Secondary | ICD-10-CM

## 2018-09-29 NOTE — Telephone Encounter (Signed)
Next appt scheduled 11/5 with PCP. 

## 2018-10-02 MED ORDER — ROSUVASTATIN CALCIUM 20 MG PO TABS: 20.0000 mg | ORAL_TABLET | Freq: Every day | ORAL | 6 refills | Status: DC

## 2018-10-02 NOTE — Telephone Encounter (Signed)
refilled 

## 2018-10-04 ENCOUNTER — Encounter: Payer: Self-pay | Admitting: Internal Medicine

## 2018-10-04 ENCOUNTER — Ambulatory Visit (INDEPENDENT_AMBULATORY_CARE_PROVIDER_SITE_OTHER): Payer: Federal, State, Local not specified - PPO | Admitting: Internal Medicine

## 2018-10-04 VITALS — BP 150/79 | HR 62 | Temp 98.1°F | Wt 204.9 lb

## 2018-10-04 DIAGNOSIS — E876 Hypokalemia: Secondary | ICD-10-CM

## 2018-10-04 DIAGNOSIS — D509 Iron deficiency anemia, unspecified: Secondary | ICD-10-CM

## 2018-10-04 DIAGNOSIS — I10 Essential (primary) hypertension: Secondary | ICD-10-CM

## 2018-10-04 DIAGNOSIS — K219 Gastro-esophageal reflux disease without esophagitis: Secondary | ICD-10-CM | POA: Diagnosis not present

## 2018-10-04 DIAGNOSIS — Z79899 Other long term (current) drug therapy: Secondary | ICD-10-CM

## 2018-10-04 DIAGNOSIS — R42 Dizziness and giddiness: Secondary | ICD-10-CM

## 2018-10-04 DIAGNOSIS — R413 Other amnesia: Secondary | ICD-10-CM

## 2018-10-04 MED ORDER — OMEPRAZOLE 20 MG PO CPDR
20.0000 mg | DELAYED_RELEASE_CAPSULE | Freq: Every day | ORAL | 3 refills | Status: DC
Start: 2018-10-04 — End: 2022-02-09

## 2018-10-04 NOTE — Progress Notes (Signed)
CC: HTN follow up, iron deficiency anemia  HPI:  [name] is a 67 y.o. female with PMH below.  Today we will address HTN follow up, iron deficiency anemia.  Unfortunately we spent most of our visit talking about the patient's FMLA paperwork.  She tells me she has trouble focusing at work and remembering tasks.  Occasionally she also says she gets dizzy when getting up and walking in the hallways she thinks this is a result of her hypertension.  She asked if I will fill out paperwork stating that she does not have been to work due to her medical conditions namely hypertension.  We tested her orthostatic vital signs today which were normal.  I offered to have her come back and take a [place] cognitive assessment and to have some memory testing done.  She is very upset that I would not fill out the forms.  I explained that she will dispense the right disability however I do not see a medical indication for why she cannot go to work.    Please see A&P for status of the patient's chronic medical conditions  Past Medical History:  Diagnosis Date  . Anemia   . 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 GERD (gastroesophageal reflux disease)   . History of blood transfusion 03/09/2018   "low HgB"  . Hypertension    Review of Systems:  ROS: Pulmonary: pt denies increased work of breathing, shortness of breath,  Cardiac: pt denies palpitations, chest pain,  Abdominal: pt denies abdominal pain, nausea, vomiting, or diarrhea  Physical Exam:  Vitals:   10/04/18 1104  BP: (!) 150/79  Pulse: 62  Temp: 98.1 F (36.7 C)  TempSrc: Oral  SpO2: 97%  Weight: 204 lb 14.4 oz (92.9 kg)   Cardiac: normal rate and rhythm, clear s1 and s2 Pulmonary: CTAB, not in distress Abdominal: non distended abdomen, soft and nontender Extremities: no LE edema Psych: Alert, conversant, in good spirits   Social History   Socioeconomic History  . Marital status: Widowed     Spouse name: Not on file  . Number of children: Not on file  . Years of education: Not on file  . Highest education level: Not on file  Occupational History  . Not on file  Social Needs  . Financial resource strain: Not on file  . Food insecurity:    Worry: Not on file    Inability: Not on file  . Transportation needs:    Medical: Not on file    Non-medical: Not on file  Tobacco Use  . Smoking status: Never Smoker  . Smokeless tobacco: Never Used  Substance and Sexual Activity  . Alcohol use: Not Currently  . Drug use: Never  . Sexual activity: Yes  Lifestyle  . Physical activity:    Days per week: Not on file    Minutes per session: Not on file  . Stress: Not on file  Relationships  . Social connections:    Talks on phone: Not on file    Gets together: Not on file    Attends religious service: Not on file    Active member of club or organization: Not on file    Attends meetings of clubs or organizations: Not on file    Relationship status: Not on file  . Intimate partner violence:    Fear of current or ex partner: Not on file    Emotionally abused: Not  on file    Physically abused: Not on file    Forced sexual activity: Not on file  Other Topics Concern  . Not on file  Social History Narrative  . Not on file    No family history on file.  Assessment & Plan:   See Encounters Tab for problem based charting.  Patient discussed with Dr. Lynnae January

## 2018-10-04 NOTE — Patient Instructions (Signed)
Kayla Williams, Your blood pressure is much better today.  I would like to check it again in a few weeks and see how it is doing.  We may have to add another medicine to your regimen.  We will check some lab work.  Make sure your hemoglobin level is normal and your iron level is continuing to increase.  Your orthostatic vital signs were normal today.  I don't think that your blood pressure medicine is causing your dizziness or lack of concentration.  I understand your concerns and we will look into why you're having trouble with your memory and concentration.  We will start by administering a cognitive test to you in our [facility] clinic.

## 2018-10-05 DIAGNOSIS — K219 Gastro-esophageal reflux disease without esophagitis: Secondary | ICD-10-CM | POA: Insufficient documentation

## 2018-10-05 MED ORDER — POLYSACCHARIDE IRON COMPLEX 150 MG PO CAPS: 150.0000 mg | ORAL_CAPSULE | Freq: Two times a day (BID) | ORAL | 3 refills | Status: DC

## 2018-10-05 MED ORDER — POTASSIUM CHLORIDE 20 MEQ PO PACK: 40.0000 meq | PACK | Freq: Every day | ORAL | 0 refills | Status: DC

## 2018-10-05 NOTE — Assessment & Plan Note (Signed)
Hemoglobin is stable but higher labs today is persistently low.  She denies any abdominal pain she reports good control of her GERD on omeprazole.  She denies any blood in her bowel movements or anywhere else.    -Increase her iron dose 150 mg ferrous sulfate twice daily once daily -We will continue to monitor, if persistently low may have to repeat EGD.  She is up-to-date on colonoscopy

## 2018-10-05 NOTE — Assessment & Plan Note (Addendum)
BP Readings from Last 3 Encounters:  10/04/18 (!) 150/79  09/26/18 (!) 206/92  07/25/18 (!) 143/66   Last visit pts blood pressure severely elevated, she stopped her amlodipine without instruction to do so.   We added chlorthalidone 25mg  one week ago.  Her current regimen should be amlodipine 10mg , labetalol 200mg  BID, and chlorthalidone 25mg  daily.  Today she says she is taking her amlodipine, labetalol and chlorthalidone this morning although it was later in the morning before her arrival.    -repeat BMP today shows another elevation in her creatinine.  She is slightly hypokalemic.  We will recheck renal function at her next visit.  I think we have to see how her creatinine is at the next visit.  We can't get control of her HTN with no renally active meds. -Called patient and discussed supplement of potassium will reassess bmp at next visit

## 2018-10-05 NOTE — Progress Notes (Signed)
Internal Medicine Clinic Attending  Case discussed with Dr. Shan Levans at the time of the visit.  We reviewed the resident's history and exam and pertinent patient test results.  I agree with the assessment, diagnosis, and plan of care documented in the resident's note.  Dr Shan Levans discussed the [facility] request with me and I agree that HTN is not a basis for [facility] and that pt should return for memory testing as outlined by Dr Shan Levans.

## 2018-10-06 LAB — TRANSFERRIN SATURATION
IRON SATN MFR SERPL: 18 %{saturation}
IRON SERPL-MCNC: 82 ug/dL
TRANSFERRIN SERPL-MCNC: 325 mg/dL

## 2018-10-06 LAB — BMP8+ANION GAP
Anion Gap: 20 mmol/L — ABNORMAL HIGH (ref 10.0–18.0)
BUN / CREAT RATIO: 13 (ref 12–28)
BUN: 17 mg/dL (ref 8–27)
CO2: 25 mmol/L (ref 20–29)
CREATININE: 1.32 mg/dL — AB (ref 0.57–1.00)
Calcium: 9.9 mg/dL (ref 8.7–10.3)
Chloride: 96 mmol/L (ref 96–106)
GFR calc Af Amer: 48 mL/min/{1.73_m2} — ABNORMAL LOW (ref >59)
GFR, EST NON AFRICAN AMERICAN: 42 mL/min/{1.73_m2} — AB (ref >59)
Glucose: 102 mg/dL — ABNORMAL HIGH (ref 65–99)
Potassium: 3 mmol/L — ABNORMAL LOW (ref 3.5–5.2)
Sodium: 141 mmol/L (ref 134–144)

## 2018-10-06 LAB — CBC
HEMATOCRIT: 34.6 % (ref 34.0–46.6)
Hemoglobin: 11.5 g/dL (ref 11.1–15.9)
MCH: 26 pg — ABNORMAL LOW (ref 26.6–33.0)
MCHC: 33.2 g/dL (ref 31.5–35.7)
MCV: 78 fL — AB (ref 79–97)
PLATELETS: 359 10*3/uL (ref 150–450)
RBC: 4.43 x10E6/uL (ref 3.77–5.28)
RDW: 15.6 % — AB (ref 12.3–15.4)
WBC: 3.4 10*3/uL (ref 3.4–10.8)

## 2018-10-06 LAB — FERRITIN: Ferritin: 59 ng/mL (ref 15–150)

## 2018-10-31 ENCOUNTER — Ambulatory Visit (INDEPENDENT_AMBULATORY_CARE_PROVIDER_SITE_OTHER): Payer: Federal, State, Local not specified - PPO | Admitting: Internal Medicine

## 2018-10-31 ENCOUNTER — Other Ambulatory Visit: Payer: Self-pay

## 2018-10-31 ENCOUNTER — Encounter: Payer: Self-pay | Admitting: Internal Medicine

## 2018-10-31 VITALS — BP 150/79 | HR 62 | Temp 97.5°F | Ht 67.0 in | Wt 208.5 lb

## 2018-10-31 DIAGNOSIS — Z79899 Other long term (current) drug therapy: Secondary | ICD-10-CM

## 2018-10-31 DIAGNOSIS — I10 Essential (primary) hypertension: Secondary | ICD-10-CM

## 2018-10-31 NOTE — Assessment & Plan Note (Addendum)
Patient presents for BP follow up and Labs. At last visit patient was noted to have BP 150/79 with elevated Cr and low K on labs after starting Chlorthalidone. BP today is stable at 150/79, will repeat BMP to assess renal function and potassium. Will continue current regimen and make adjustments as needed pending lab results, otherwise patient is to follow up with PCP. - Amlodipine 10mg  Daily - Labetalol 200mg  BID - Chlorthalidone 25mg  Daily - BMP  ADDENDUM: BMP showed Cr improved to 1.18 and K remains low at 3.0. Attempted to contact patient with results but there was no answer. Will enter separate telephone note

## 2018-10-31 NOTE — Progress Notes (Signed)
   CC: Hypertension  HPI:  Kayla Williams is a 67 y.o. F with PMHx listed below presenting for Hypertension. Please see the A&P for the status of the Kayla Williams's chronic medical problems.   Past Medical History:  Diagnosis Date  . Anemia   . 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 GERD (gastroesophageal reflux disease)   . History of blood transfusion 03/09/2018   "low HgB"  . Hypertension    Review of Systems:  Performed and all others negative.  Physical Exam:  Vitals:   10/31/18 1324  BP: (!) 150/79  Pulse: 62  Temp: (!) 97.5 F (36.4 C)  TempSrc: Oral  SpO2: 99%  Weight: 208 lb 8 oz (94.6 kg)  Height: 5\' 7"  (1.702 m)   Physical Exam  Constitutional: Kayla Williams appears well-developed and well-nourished. No distress.  Cardiovascular: Normal rate, regular rhythm, normal heart sounds and intact distal pulses.  Pulmonary/Chest: Effort normal and breath sounds normal. No respiratory distress.  Abdominal: Soft. Bowel sounds are normal. Kayla Williams exhibits no distension. There is no tenderness.  Musculoskeletal: Kayla Williams exhibits no edema or deformity.  Skin: Skin is warm and dry.   Assessment & Plan:   See Encounters Tab for problem based charting.  Kayla Williams discussed with Dr. Dareen Piano

## 2018-10-31 NOTE — Patient Instructions (Addendum)
Thank you for allowing [place] to care for you  For your hypertension - Continue to take amlodipine, chlorthalidone, and labetalol - We are rechecking your kidney function today - Adjustments may need to be made to your medications depending on your lab results  Please follow up with PCP in the next 3 months or earlier as instructed

## 2018-11-01 ENCOUNTER — Telehealth: Payer: Self-pay | Admitting: Internal Medicine

## 2018-11-01 LAB — BMP8+ANION GAP
ANION GAP: 19 mmol/L — AB (ref 10.0–18.0)
BUN/Creatinine Ratio: 14 (ref 12–28)
BUN: 16 mg/dL (ref 8–27)
CHLORIDE: 101 mmol/L (ref 96–106)
CO2: 26 mmol/L (ref 20–29)
Calcium: 9.7 mg/dL (ref 8.7–10.3)
Creatinine, Ser: 1.18 mg/dL — ABNORMAL HIGH (ref 0.57–1.00)
GFR calc non Af Amer: 48 mL/min/{1.73_m2} — ABNORMAL LOW (ref >59)
GFR, EST AFRICAN AMERICAN: 55 mL/min/{1.73_m2} — AB (ref >59)
GLUCOSE: 93 mg/dL (ref 65–99)
POTASSIUM: 3 mmol/L — AB (ref 3.5–5.2)
Sodium: 146 mmol/L — ABNORMAL HIGH (ref 134–144)

## 2018-11-01 NOTE — Telephone Encounter (Signed)
Attempted to call patient to inform her of her recent lab result showing continued low potassium. Will need to confirm that she has been taking the prescribed potassium supplement every day to determine if increased dose is needed. Will need follow up in 1 month to re-evaluate.  Pearson Grippe, DO IM PGY-2

## 2018-11-02 NOTE — Telephone Encounter (Signed)
Call placed to patient to relay below, confirm patient's potassium usage and schedule appt. No answer. No ability to leave VM. Hubbard Hartshorn, RN, BSN

## 2018-11-02 NOTE — Progress Notes (Signed)
Internal Medicine Clinic Attending  Case discussed with Dr. Melvin  at the time of the visit.  We reviewed the resident's history and exam and pertinent patient test results.  I agree with the assessment, diagnosis, and plan of care documented in the resident's note.  

## 2018-11-04 ENCOUNTER — Other Ambulatory Visit: Payer: Self-pay | Admitting: Internal Medicine

## 2018-11-04 DIAGNOSIS — I10 Essential (primary) hypertension: Secondary | ICD-10-CM

## 2018-11-04 NOTE — Telephone Encounter (Signed)
Attempted to call patient again with results and to verify she has been taking her potassium daily. No answer.   Pearson Grippe, PGY-2

## 2018-11-04 NOTE — Telephone Encounter (Signed)
refilled 

## 2018-11-09 NOTE — Telephone Encounter (Signed)
Called, no answer.

## 2019-01-02 ENCOUNTER — Encounter: Payer: Federal, State, Local not specified - PPO | Admitting: Internal Medicine

## 2019-02-06 ENCOUNTER — Encounter: Payer: Federal, State, Local not specified - PPO | Admitting: Internal Medicine

## 2019-06-16 NOTE — Progress Notes (Deleted)
   CC: ***  HPI:  [name] is a 68 y.o. female with PMH below.  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  Last bp check was about one month prior checked it in the morning before her medications.  She feels it was pretty good but doesn't remember what it was.  Feels well.  She has lost about 7lbs the last few months. She has opted to eat bananas would rather not take potassium.      Please see A&P for status of the patient's chronic medical conditions  Past Medical History:  Diagnosis Date  . Anemia   . 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 GERD (gastroesophageal reflux disease)   . History of blood transfusion 03/09/2018   "low HgB"  . Hypertension    Review of Systems:  ***  Physical Exam:  There were no vitals filed for this visit. ***  Social History   Socioeconomic History  . Marital status: Widowed    Spouse name: Not on file  . Number of children: Not on file  . Years of education: Not on file  . Highest education level: Not on file  Occupational History  . Not on file  Social Needs  . Financial resource strain: Not on file  . Food insecurity    Worry: Not on file    Inability: Not on file  . Transportation needs    Medical: Not on file    Non-medical: Not on file  Tobacco Use  . Smoking status: Never Smoker  . Smokeless tobacco: Never Used  Substance and Sexual Activity  . Alcohol use: Not Currently  . Drug use: Never  . Sexual activity: Yes  Lifestyle  . Physical activity    Days per week: Not on file    Minutes per session: Not on file  . Stress: Not on file  Relationships  . Social Herbalist on phone: Not on file    Gets together: Not on file    Attends religious service: Not on file    Active member of club or organization: Not on file    Attends meetings of clubs or  organizations: Not on file    Relationship status: Not on file  . Intimate partner violence    Fear of current or ex partner: Not on file    Emotionally abused: Not on file    Physically abused: Not on file    Forced sexual activity: Not on file  Other Topics Concern  . Not on file  Social History Narrative  . Not on file   *** No family history on file.  Assessment & Plan:   See Encounters Tab for problem based charting.  Patient {GC/GE:[id number]::"discussed with","seen with"} Dr. {NAMES:[id number]::"Butcher","Granfortuna","E. Hoffman","Klima","Mullen","Narendra","Raines","Vincent"}

## 2019-06-20 ENCOUNTER — Other Ambulatory Visit: Payer: Self-pay

## 2019-06-20 ENCOUNTER — Encounter: Payer: Self-pay | Admitting: Internal Medicine

## 2019-06-20 ENCOUNTER — Ambulatory Visit (INDEPENDENT_AMBULATORY_CARE_PROVIDER_SITE_OTHER): Payer: Medicare Other | Admitting: Internal Medicine

## 2019-06-20 DIAGNOSIS — E611 Iron deficiency: Secondary | ICD-10-CM | POA: Diagnosis not present

## 2019-06-20 DIAGNOSIS — I10 Essential (primary) hypertension: Secondary | ICD-10-CM

## 2019-06-20 DIAGNOSIS — E785 Hyperlipidemia, unspecified: Secondary | ICD-10-CM | POA: Diagnosis not present

## 2019-06-20 DIAGNOSIS — Z79899 Other long term (current) drug therapy: Secondary | ICD-10-CM

## 2019-06-20 NOTE — Progress Notes (Signed)
   [facility] Telephone Encounter  Reason for call:   This telephone encounter was created for [name] on 06/20/2019 for the following purpose/cc HTN follow up.   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  Last bp check was about one month prior checked it in the morning before her medications. She says it has been considerably better since her last office visit but cannot recall specific numbers.  She does have a home cuff. She feels it was pretty good but doesn't remember what it was.  Feels well.  She has lost about 7lbs the last few months. Discussed rechecking her potassium as she had a low reading on last lab and has been off supplements for several months however she is fearful of going to [facility] or our clinic.         -She will log a week of bp readings at home and send me the results will adjust as           needed      -no labs until coronavirus numbers decrease per the patient's request      -discussed DASH diet as well as the weight loss as a way to possibly decrease               medication regimen  As always, pt is advised that if symptoms worsen or new symptoms arise, they should go to an urgent care facility or to to ER for further evaluation.   Consent and Medical Decision Making:   Patient discussed with Dr. Rebeca Alert  This is a telephone encounter between Kayla Williams and Kayla Williams on 06/20/2019 for HTN follow up. The visit was conducted with the patient located at home and Kayla Williams at [facility]. The patient's identity was confirmed using their DOB and current address. The patient has consented to being evaluated through a telephone encounter and understands the associated risks (an examination cannot be done and the patient may need to come in for an appointment) / benefits (allows the patient to remain at home, decreasing exposure to coronavirus). I personally spent 15 minutes on medical discussion.

## 2019-06-20 NOTE — Assessment & Plan Note (Addendum)
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  Last bp check was about one month prior checked it in the morning before her medications. She says it has been considerably better since her last office visit but cannot recall specific numbers.  She does have a home cuff. She feels it was pretty good but doesn't remember what it was.  Feels well.  She has lost about 7lbs the last few months. Discussed rechecking her potassium as she had a low reading on last lab and has been off supplements for several months however she is fearful of going to [facility] or our clinic.    -She will log a week of bp readings at home and send me the results will adjust as needed -no labs until coronavirus numbers decrease per the patient's request -discussed DASH diet as well as the weight loss as a way to possibly decrease medication regimen

## 2019-06-22 NOTE — Progress Notes (Signed)
Internal Medicine Clinic Attending  Case discussed with Dr. Winfrey at the time of the visit.  We reviewed the resident's history and exam and pertinent patient test results.  I agree with the assessment, diagnosis, and plan of care documented in the resident's note.  Alexander Raines, M.D., Ph.D.  

## 2019-09-07 ENCOUNTER — Other Ambulatory Visit: Payer: Self-pay

## 2019-09-07 ENCOUNTER — Ambulatory Visit: Payer: Medicare Other | Admitting: Internal Medicine

## 2019-09-07 VITALS — BP 148/86 | HR 87 | Temp 98.3°F | Ht 67.0 in | Wt 203.7 lb

## 2019-09-07 DIAGNOSIS — Z79899 Other long term (current) drug therapy: Secondary | ICD-10-CM

## 2019-09-07 DIAGNOSIS — E876 Hypokalemia: Secondary | ICD-10-CM

## 2019-09-07 DIAGNOSIS — R55 Syncope and collapse: Secondary | ICD-10-CM

## 2019-09-07 DIAGNOSIS — I10 Essential (primary) hypertension: Secondary | ICD-10-CM | POA: Diagnosis not present

## 2019-09-07 MED ORDER — CHLORTHALIDONE 25 MG PO TABS: 12.5000 mg | ORAL_TABLET | Freq: Every day | ORAL | 1 refills | Status: DC

## 2019-09-07 MED ORDER — AMLODIPINE BESYLATE 10 MG PO TABS: 10.0000 mg | ORAL_TABLET | Freq: Every day | ORAL | 5 refills | Status: DC

## 2019-09-07 NOTE — Assessment & Plan Note (Addendum)
Patient reports an episode of light-headedness yesterday. 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 She report mild dizziness as well. She denies loss of consciousness or palpitations.  She states she had a somewhat similar episode in the past consisting of dizziness after she had felt weak and tired for a time and she was found to be anemic. She denies similar weakness or tiredness and has had no signs of bleeding. Orthostatics performed today were negative.  Her current episode is most consistent with a vasovagal event. We will continue to monitor for now.

## 2019-09-07 NOTE — Progress Notes (Signed)
   CC: Vasovagal Episode, Hypertension  HPI:  Kayla Williams is a 68 y.o. F with PMHx listed below presenting for Vasovagal Episode, Hypertension. Please see the A&P for the status of the patient's chronic medical problems.  Past Medical History:  Diagnosis Date  . Anemia   . 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 GERD (gastroesophageal reflux disease)   . History of blood transfusion 03/09/2018   "low HgB"  . Hypertension    Review of Systems:  Performed and all others negative.  Physical Exam:   Vitals:   09/07/19 1450  BP: (!) 148/86  Pulse: 87  Temp: 98.3 F (36.8 C)  TempSrc: Oral  SpO2: 100%  Weight: 203 lb 11.2 oz (92.4 kg)  Height: 5\' 7"  (1.702 m)   Physical Exam Constitutional:      General: She is not in acute distress.    Appearance: Normal appearance.  Cardiovascular:     Rate and Rhythm: Normal rate and regular rhythm.     Pulses: Normal pulses.     Heart sounds: Normal heart sounds.  Pulmonary:     Effort: Pulmonary effort is normal. No respiratory distress.     Breath sounds: Normal breath sounds.  Abdominal:     General: Bowel sounds are normal. There is no distension.     Palpations: Abdomen is soft.     Tenderness: There is no abdominal tenderness.  Musculoskeletal:        General: No swelling or deformity.  Skin:    General: Skin is warm and dry.  Neurological:     General: No focal deficit present.     Mental Status: Mental status is at baseline.    Assessment & Plan:   See Encounters Tab for problem based charting.  Patient discussed with Dr. Dareen Piano

## 2019-09-07 NOTE — Assessment & Plan Note (Addendum)
Patient noted to have BP elevated to 148/86. She states she has not been taking her labetalol nor her chlorthalidone for about 6 months or longer after loosing weight and improvement in her blood pressure. She has continue to take amlodipine. We discussed the need to restart a medication and she would prefer to try chlorthalidone. We will restart at half dose of 12.5mg  Daily given mild BP elevation and history of Hypokalemia. Will recheck labs today and in 1 month. If hypokalemia remains and issue will likely need to switch to labetalol as her second agent.  - Labetalol discontinued - Amlodipine 10mg  Daily - Chlorthalidone 12.g Daily - BMP - Follow up in 1 month for BP check and Labs  ADDENDUM: BMP showed mildly low K at 3.2. Due to this her second antihypertensive has been changed to Labetalol 100mg  BID and Chlorthalidone has been discontinued. Patient was contacted by phone about results and change in therapy. Pharmacy was contacted an informed of change in therapy. - Amlodipine 10mg  Daily - Labetalol 100mg  BID - Discontinue Chlorthalidone - Follow up in 1 month as planned

## 2019-09-07 NOTE — Patient Instructions (Addendum)
Thank you for allowing [place] to care for you  For you episode of light headedness - This was likely a vaso-vagal event - We will contionue to monitor for further episodes  For you high blood pressure - BP elevated today - Continue Amlodipine 10mg  Daily - We will restart your Chlorthalidone at 12.5mg  Daily  Please follow up in about 1 month for repeat check and Labs

## 2019-09-08 LAB — BMP8+ANION GAP
Anion Gap: 22 mmol/L — ABNORMAL HIGH (ref 10.0–18.0)
BUN/Creatinine Ratio: 14 (ref 12–28)
BUN: 16 mg/dL (ref 8–27)
CO2: 20 mmol/L (ref 20–29)
Calcium: 9.9 mg/dL (ref 8.7–10.3)
Chloride: 102 mmol/L (ref 96–106)
Creatinine, Ser: 1.14 mg/dL — ABNORMAL HIGH (ref 0.57–1.00)
GFR calc Af Amer: 57 mL/min/{1.73_m2} — ABNORMAL LOW (ref >59)
GFR calc non Af Amer: 50 mL/min/{1.73_m2} — ABNORMAL LOW (ref >59)
Glucose: 98 mg/dL (ref 65–99)
Potassium: 3.2 mmol/L — ABNORMAL LOW (ref 3.5–5.2)
Sodium: 144 mmol/L (ref 134–144)

## 2019-09-08 MED ORDER — LABETALOL HCL 100 MG PO TABS: 100.0000 mg | ORAL_TABLET | Freq: Two times a day (BID) | ORAL | 2 refills | Status: DC

## 2019-09-08 NOTE — Addendum Note (Signed)
Addended by: Neva Seat B on: 09/08/2019 09:16 AM   Modules accepted: Orders

## 2019-09-08 NOTE — Assessment & Plan Note (Signed)
ADDDENDUM: BMP this visit showed mild persistent hypokalemia at 3.2. This is improved from 3.0 after discontinuing her chlorthalidone on her own. Due to this we have discontinued chlorthalidone (see HTN note for details). She states she has only taken 1 or 2 packet of Potassium that she was previously prescribed per month, so it is unlikely this is having much effect. Will hold off on refill of potassium for now and consider restarting at recheck depending on levels. - Continue to monitor - BMP at follow up

## 2019-09-08 NOTE — Progress Notes (Signed)
Internal Medicine Clinic Attending  Case discussed with Dr. Melvin  at the time of the visit.  We reviewed the resident's history and exam and pertinent patient test results.  I agree with the assessment, diagnosis, and plan of care documented in the resident's note.  

## 2020-01-28 ENCOUNTER — Ambulatory Visit: Payer: Federal, State, Local not specified - PPO | Attending: Internal Medicine

## 2020-01-28 DIAGNOSIS — Z23 Encounter for immunization: Secondary | ICD-10-CM

## 2020-01-28 NOTE — Progress Notes (Signed)
   Covid-19 Vaccination Clinic  Name:  [name]    MRN: [id number] DOB: 11/15/1951  01/28/2020  [name] was observed post Covid-19 immunization for 15 minutes without incidence. She was provided with Vaccine Information Sheet and instruction to access the V-Safe system.   [name] was instructed to call 911 with any severe reactions post vaccine: Marland Kitchen Difficulty breathing  . Swelling of your face and throat  . A fast heartbeat  . A bad rash all over your body  . Dizziness and weakness    Immunizations Administered    Name Date Dose VIS Date Route   Pfizer COVID-19 Vaccine 01/28/2020  8:51 AM 0.3 mL 11/10/2019 Intramuscular   Manufacturer: [place]   Lot: [id number]   [place]: [id number]

## 2020-02-19 ENCOUNTER — Ambulatory Visit: Payer: Federal, State, Local not specified - PPO | Attending: Internal Medicine

## 2020-02-19 DIAGNOSIS — Z23 Encounter for immunization: Secondary | ICD-10-CM

## 2020-02-19 NOTE — Progress Notes (Signed)
   Covid-19 Vaccination Clinic  Name:  Kayla Williams    MRN: [id number] DOB: 1951-11-01  02/19/2020  [name] was observed post Covid-19 immunization for 15 minutes without incident. She was provided with Vaccine Information Sheet and instruction to access the V-Safe system.   [name] was instructed to call 911 with any severe reactions post vaccine: Marland Kitchen Difficulty breathing  . Swelling of face and throat  . A fast heartbeat  . A bad rash all over body  . Dizziness and weakness   Immunizations Administered    Name Date Dose VIS Date Route   Pfizer COVID-19 Vaccine 02/19/2020  8:46 AM 0.3 mL 11/10/2019 Intramuscular   Manufacturer: [place]   Lot: [id number]   [place]: [id number]

## 2020-03-05 ENCOUNTER — Encounter: Payer: Self-pay | Admitting: Internal Medicine

## 2020-03-05 ENCOUNTER — Ambulatory Visit (INDEPENDENT_AMBULATORY_CARE_PROVIDER_SITE_OTHER): Payer: Medicare Other | Admitting: Internal Medicine

## 2020-03-05 ENCOUNTER — Other Ambulatory Visit: Payer: Self-pay

## 2020-03-05 DIAGNOSIS — I10 Essential (primary) hypertension: Secondary | ICD-10-CM | POA: Diagnosis present

## 2020-03-05 DIAGNOSIS — E7800 Pure hypercholesterolemia, unspecified: Secondary | ICD-10-CM

## 2020-03-05 DIAGNOSIS — Z79899 Other long term (current) drug therapy: Secondary | ICD-10-CM | POA: Diagnosis not present

## 2020-03-05 MED ORDER — ROSUVASTATIN CALCIUM 20 MG PO TABS: 20.0000 mg | ORAL_TABLET | Freq: Every day | ORAL | 6 refills | Status: DC

## 2020-03-05 MED ORDER — AMLODIPINE BESYLATE 10 MG PO TABS: 10.0000 mg | ORAL_TABLET | Freq: Every day | ORAL | 5 refills | Status: DC

## 2020-03-05 MED ORDER — LABETALOL HCL 100 MG PO TABS: 100.0000 mg | ORAL_TABLET | Freq: Two times a day (BID) | ORAL | 1 refills | Status: DC

## 2020-03-05 NOTE — Progress Notes (Signed)
   CC: HTN follow up  HPI:  [name] is a 69 y.o. female with PMH below.  Today we will address HTN.  We also discussed health maintenance items but she would like to defer mammogram and dexa scan until later this year.    Please see A&P for status of the patient's chronic medical conditions  Past Medical History:  Diagnosis Date  . Anemia   . 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 GERD (gastroesophageal reflux disease)   . History of blood transfusion 03/09/2018   "low HgB"  . Hypertension    Review of Systems:  ROS: Pulmonary: pt denies increased work of breathing, shortness of breath,  Cardiac: pt denies palpitations, chest pain,  Abdominal: pt denies abdominal pain, nausea, vomiting, or diarrhea   Physical Exam:  Vitals:   03/05/20 0848  BP: 140/79  Pulse: 70  Temp: 98.2 F (36.8 C)  TempSrc: Oral  SpO2: 98%  Weight: 204 lb 14.4 oz (92.9 kg)  Height: 5\' 7"  (1.702 m)   Cardiac: JVD flat, normal rate and rhythm, clear s1 and s2, no murmurs, rubs or gallops, no LE edema Pulmonary: CTAB, not in distress Abdominal: non distended abdomen, soft and nontender Psych: Alert, conversant, in good spirits   Social History   Socioeconomic History  . Marital status: Widowed    Spouse name: Not on file  . Number of children: Not on file  . Years of education: Not on file  . Highest education level: Not on file  Occupational History  . Not on file  Tobacco Use  . Smoking status: Never Smoker  . Smokeless tobacco: Never Used  Substance and Sexual Activity  . Alcohol use: Not Currently  . Drug use: Never  . Sexual activity: Yes  Other Topics Concern  . Not on file  Social History Narrative  . Not on file   Social Determinants of Health   Financial Resource Strain:   . Difficulty of Paying Living Expenses:   Food Insecurity:   . Worried About Charity fundraiser in the Last Year:   . Arboriculturist in the Last Year:     Transportation Needs:   . Film/video editor (Medical):   Marland Kitchen Lack of Transportation (Non-Medical):   Physical Activity:   . Days of Exercise per Week:   . Minutes of Exercise per Session:   Stress:   . Feeling of Stress :   Social Connections:   . Frequency of Communication with Friends and Family:   . Frequency of Social Gatherings with Friends and Family:   . Attends Religious Services:   . Active Member of Clubs or Organizations:   . Attends Archivist Meetings:   Marland Kitchen Marital Status:   Intimate Partner Violence:   . Fear of Current or Ex-Partner:   . Emotionally Abused:   Marland Kitchen Physically Abused:   . Sexually Abused:     No family history on file.  Assessment & Plan:   See Encounters Tab for problem based charting.  Patient discussed with Dr. Angelia Mould

## 2020-03-05 NOTE — Patient Instructions (Addendum)
Kayla Williams you are doing well, we will need to continue to keep an eye on your blood pressure.  For now we will continue you on amlodipine 10mg  daily and labetalol 100mg  twice daily.  Please consider getting your mammogram and bone density screening now that you are vaccinated for covid 19.  I have added the Dash diet to help with hypertension also.   DASH Eating Plan DASH stands for "Dietary Approaches to Stop Hypertension." The DASH eating plan is a healthy eating plan that has been shown to reduce high blood pressure (hypertension). It may also reduce your risk for type 2 diabetes, heart disease, and stroke. The DASH eating plan may also help with weight loss. What are tips for following this plan?  General guidelines  Avoid eating more than 2,300 mg (milligrams) of salt (sodium) a day. If you have hypertension, you may need to reduce your sodium intake to 1,500 mg a day.  Limit alcohol intake to no more than 1 drink a day for nonpregnant women and 2 drinks a day for men. One drink equals 12 oz of beer, 5 oz of wine, or 1 oz of hard liquor.  Work with your health care provider to maintain a healthy body weight or to lose weight. Ask what an ideal weight is for you.  Get at least 30 minutes of exercise that causes your heart to beat faster (aerobic exercise) most days of the week. Activities may include walking, swimming, or biking.  Work with your health care provider or diet and nutrition specialist (dietitian) to adjust your eating plan to your individual calorie needs. Reading food labels   Check food labels for the amount of sodium per serving. Choose foods with less than 5 percent of the Daily Value of sodium. Generally, foods with less than 300 mg of sodium per serving fit into this eating plan.  To find whole grains, look for the word "whole" as the first word in the ingredient list. Shopping  Buy products labeled as "low-sodium" or "no salt added."  Buy fresh foods. Avoid canned  foods and premade or frozen meals. Cooking  Avoid adding salt when cooking. Use salt-free seasonings or herbs instead of table salt or sea salt. Check with your health care provider or pharmacist before using salt substitutes.  Do not fry foods. Cook foods using healthy methods such as baking, boiling, grilling, and broiling instead.  Cook with heart-healthy oils, such as olive, canola, soybean, or sunflower oil. Meal planning  Eat a balanced diet that includes: ? 5 or more servings of fruits and vegetables each day. At each meal, try to fill half of your plate with fruits and vegetables. ? Up to 6-8 servings of whole grains each day. ? Less than 6 oz of lean meat, poultry, or fish each day. A 3-oz serving of meat is about the same size as a deck of cards. One egg equals 1 oz. ? 2 servings of low-fat dairy each day. ? A serving of nuts, seeds, or beans 5 times each week. ? Heart-healthy fats. Healthy fats called Omega-3 fatty acids are found in foods such as flaxseeds and coldwater fish, like sardines, salmon, and mackerel.  Limit how much you eat of the following: ? Canned or prepackaged foods. ? Food that is high in trans fat, such as fried foods. ? Food that is high in saturated fat, such as fatty meat. ? Sweets, desserts, sugary drinks, and other foods with added sugar. ? Full-fat dairy products.  Do not salt foods before eating.  Try to eat at least 2 vegetarian meals each week.  Eat more home-cooked food and less restaurant, buffet, and fast food.  When eating at a restaurant, ask that your food be prepared with less salt or no salt, if possible. What foods are recommended? The items listed may not be a complete list. Talk with your dietitian about what dietary choices are best for you. Grains Whole-grain or whole-wheat bread. Whole-grain or whole-wheat pasta. Brown rice. [place]. Bulgur. Whole-grain and low-sodium cereals. Pita bread. Low-fat, low-sodium crackers.  Whole-wheat flour tortillas. Vegetables Fresh or frozen vegetables (raw, steamed, roasted, or grilled). Low-sodium or reduced-sodium tomato and vegetable juice. Low-sodium or reduced-sodium tomato sauce and tomato paste. Low-sodium or reduced-sodium canned vegetables. Fruits All fresh, dried, or frozen fruit. Canned fruit in natural juice (without added sugar). Meat and other protein foods Skinless chicken or [place]. Ground chicken or [place]. Pork with fat trimmed off. Fish and seafood. Egg whites. Dried beans, peas, or lentils. Unsalted nuts, nut butters, and seeds. Unsalted canned beans. Lean cuts of beef with fat trimmed off. Low-sodium, lean deli meat. Dairy Low-fat (1%) or fat-free (skim) milk. Fat-free, low-fat, or reduced-fat cheeses. Nonfat, low-sodium ricotta or cottage cheese. Low-fat or nonfat yogurt. Low-fat, low-sodium cheese. Fats and oils Soft margarine without trans fats. Vegetable oil. Low-fat, reduced-fat, or light mayonnaise and salad dressings (reduced-sodium). Canola, safflower, olive, soybean, and sunflower oils. Avocado. Seasoning and other foods Herbs. Spices. Seasoning mixes without salt. Unsalted popcorn and pretzels. Fat-free sweets. What foods are not recommended? The items listed may not be a complete list. Talk with your dietitian about what dietary choices are best for you. Grains Baked goods made with fat, such as croissants, muffins, or some breads. Dry pasta or rice meal packs. Vegetables Creamed or fried vegetables. Vegetables in a cheese sauce. Regular canned vegetables (not low-sodium or reduced-sodium). Regular canned tomato sauce and paste (not low-sodium or reduced-sodium). Regular tomato and vegetable juice (not low-sodium or reduced-sodium). Angie Fava. Olives. Fruits Canned fruit in a light or heavy syrup. Fried fruit. Fruit in cream or butter sauce. Meat and other protein foods Fatty cuts of meat. Ribs. Fried meat. Berniece Salines. Sausage. Bologna and other  processed lunch meats. Salami. Fatback. Hotdogs. Bratwurst. Salted nuts and seeds. Canned beans with added salt. Canned or smoked fish. Whole eggs or egg yolks. Chicken or [place] with skin. Dairy Whole or 2% milk, cream, and half-and-half. Whole or full-fat cream cheese. Whole-fat or sweetened yogurt. Full-fat cheese. Nondairy creamers. Whipped toppings. Processed cheese and cheese spreads. Fats and oils Butter. Stick margarine. Lard. Shortening. Ghee. Bacon fat. Tropical oils, such as coconut, palm kernel, or palm oil. Seasoning and other foods Salted popcorn and pretzels. Onion salt, garlic salt, seasoned salt, table salt, and sea salt. Worcestershire sauce. Tartar sauce. Barbecue sauce. Teriyaki sauce. Soy sauce, including reduced-sodium. Steak sauce. Canned and packaged gravies. Fish sauce. Oyster sauce. Cocktail sauce. Horseradish that you find on the shelf. Ketchup. Mustard. Meat flavorings and tenderizers. Bouillon cubes. Hot sauce and Tabasco sauce. Premade or packaged marinades. Premade or packaged taco seasonings. Relishes. Regular salad dressings. Where to find more information:  National Heart, Lung, and [place]: https://wilson-eaton.com/  American Heart Association: www.heart.org Summary  The DASH eating plan is a healthy eating plan that has been shown to reduce high blood pressure (hypertension). It may also reduce your risk for type 2 diabetes, heart disease, and stroke.  With the DASH eating plan, you should limit salt (sodium)  intake to 2,300 mg a day. If you have hypertension, you may need to reduce your sodium intake to 1,500 mg a day.  When on the DASH eating plan, aim to eat more fresh fruits and vegetables, whole grains, lean proteins, low-fat dairy, and heart-healthy fats.  Work with your health care provider or diet and nutrition specialist (dietitian) to adjust your eating plan to your individual calorie needs. This information is not intended to replace advice given to  you by your health care provider. Make sure you discuss any questions you have with your health care provider. Document Revised: 10/29/2017 Document Reviewed: 11/09/2016 Elsevier Patient Education  2020 [employer].

## 2020-03-05 NOTE — Assessment & Plan Note (Signed)
She checks bp at home as well running about the same.  She is happy with her bp she would not like to increase her labetalol dosage.  She will try lifestyle changes and increased exercise.  She needs to cut back on sodas.  I discussed the DASH diet with her.    -continue labetalol 100mg  BID, amlodipine 10mg  daily -Gave pt DASH diet plan

## 2020-03-06 NOTE — Progress Notes (Signed)
Internal Medicine Clinic Attending  Case discussed with Dr. Winfrey  at the time of the visit.  We reviewed the resident's history and exam and pertinent patient test results.  I agree with the assessment, diagnosis, and plan of care documented in the resident's note.  

## 2020-03-21 ENCOUNTER — Encounter: Payer: Self-pay | Admitting: *Deleted

## 2020-09-25 ENCOUNTER — Telehealth: Payer: Self-pay | Admitting: *Deleted

## 2020-09-25 DIAGNOSIS — I10 Essential (primary) hypertension: Secondary | ICD-10-CM

## 2020-09-26 MED ORDER — LABETALOL HCL 100 MG PO TABS: 100.0000 mg | ORAL_TABLET | Freq: Two times a day (BID) | ORAL | 1 refills | Status: DC

## 2020-09-27 NOTE — Telephone Encounter (Signed)
Attempted to contact patient this afternoon for an appt.  No answer, appt made for 10/18/2020 at 2:15 pm with Dr. Sherry Ruffing.  Message left informing patient of appt date and time and appt card has been mailed.

## 2020-10-18 ENCOUNTER — Encounter: Payer: Medicare Other | Admitting: Internal Medicine

## 2021-04-03 ENCOUNTER — Encounter: Payer: Self-pay | Admitting: *Deleted

## 2021-04-03 NOTE — Progress Notes (Signed)

## 2021-04-06 ENCOUNTER — Other Ambulatory Visit: Payer: Self-pay | Admitting: Internal Medicine

## 2021-04-06 DIAGNOSIS — I10 Essential (primary) hypertension: Secondary | ICD-10-CM

## 2021-04-06 DIAGNOSIS — E7800 Pure hypercholesterolemia, unspecified: Secondary | ICD-10-CM

## 2021-04-07 NOTE — Telephone Encounter (Signed)
She only saw him when he was a residency in pcp

## 2021-04-07 NOTE — Telephone Encounter (Signed)
This is Dr. Maudie Flakes pt

## 2021-04-07 NOTE — Progress Notes (Signed)
Things That May Be Affecting Your Health:  Alcohol  Hearing loss  Pain    Depression  Home Safety  Sexual Health   Diabetes  Lack of physical activity x Stress   Difficulty with daily activities  Loneliness  Tiredness   Drug use x Medicines  Tobacco use   Falls  Motor Vehicle Safety  Weight   Food choices  Oral Health  Other    YOUR PERSONALIZED HEALTH PLAN : 1. Schedule your next subsequent Medicare Wellness visit in one year 2. Attend all of your regular appointments to address your medical issues 3. Complete the preventative screenings and services   Annual Wellness Visit   Medicare Covered Preventative Screenings and [place] Men and Women Who How Often Need? Date of Last Service Action  Abdominal Aortic Aneurysm Adults with AAA risk factors Once      Alcohol Misuse and Counseling All Adults Screening once a year if no alcohol misuse. Counseling up to 4 face to face sessions.     Bone Density Measurement  Adults at risk for osteoporosis Once every 2 yrs x     Lipid Panel Z13.6 All adults without CV disease Once every 5 yrs       Colorectal Cancer   Stool sample or  Colonoscopy All adults 38 and older   Once every year  Every 10 years        Depression All Adults Once a year  Today   Diabetes Screening Blood glucose, post glucose load, or GTT Z13.1  All adults at risk  Pre-diabetics  Once per year  Twice per year      Diabetes  Self-Management Training All adults Diabetics 10 hrs first year; 2 hours subsequent years. Requires Copay     Glaucoma  Diabetics  Family history of glaucoma  African Americans 40 yrs +  Hispanic Americans 58 yrs + Annually - requires coppay      Hepatitis C Z72.89 or F19.20  High Risk for HCV  Born between 1945 and 1965  Annually  Once      HIV Z11.4 All adults based on risk  Annually btw ages 18 & 60 regardless of risk  Annually > 65 yrs if at increased risk      Lung Cancer Screening  Asymptomatic adults aged 71-77 with 30 pack yr history and current smoker OR quit within the last 15 yrs Annually Must have counseling and shared decision making documentation before first screen      Medical Nutrition Therapy Adults with   Diabetes  Renal disease  Kidney transplant within past 3 yrs 3 hours first year; 2 hours subsequent years     Obesity and Counseling All adults Screening once a year Counseling if BMI 30 or higher  Today   Tobacco Use Counseling Adults who use tobacco  Up to 8 visits in one year     Vaccines Z23  Hepatitis B  Influenza   Pneumonia  Adults   Once  Once every flu season  Two different vaccines separated by one year x    Next Annual Wellness Visit People with Medicare Every year  Today     Services & Screenings Women Who How Often Need  Date of Last Service Action  Mammogram  Z12.31 Women over 17 One baseline ages 1-39. Annually ager 40 yrs+ x     Pap tests All women Annually if high risk. Every 2 yrs for normal risk women  Screening for cervical cancer with   Pap (Z01.419 nl or Z01.411abnl) &  HPV Z11.51 Women aged 76 to 83 Once every 5 yrs     Screening pelvic and breast exams All women Annually if high risk. Every 2 yrs for normal risk women     Sexually Transmitted Diseases  Chlamydia  Gonorrhea  Syphilis All at risk adults Annually for non pregnant females at increased risk         [place] Men Who How Ofter Need  Date of Last Service Action  Prostate Cancer - DRE & PSA Men over 50 Annually.  DRE might require a copay.        Sexually Transmitted Diseases  Syphilis All at risk adults Annually for men at increased risk      Health Maintenance List Health Maintenance  Topic Date Due  . MAMMOGRAM  01/14/2013  . DEXA SCAN  Never done  . PNA vac Low Risk Adult (2 of 2 - PPSV23) 06/28/2019  . COVID-19 Vaccine (3 - Booster for Pfizer series) 08/21/2020  . INFLUENZA VACCINE  06/30/2021  .  COLONOSCOPY (Pts 45-61yrs Insurance coverage will need to be confirmed)  05/17/2028  . TETANUS/TDAP  06/27/2028  . Hepatitis C Screening  Completed  . HPV VACCINES  Aged Out

## 2021-04-08 NOTE — Telephone Encounter (Signed)
Pt has never been seen by our doctor or at this practice. Dr. Shan Levans is therre in your office. Please address to refill or deny the request. Thanks

## 2021-04-08 NOTE — Progress Notes (Signed)
   CC: HTN, HLD, IDA  HPI:  Kayla Williams is a 70 y.o. with a history of HTN, HLD, IDA, GERD, and seasonal allergies presenting for follow up of her HTN, HLD, and IDA.   Past Medical History:  Diagnosis Date  . Anemia   . 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 GERD (gastroesophageal reflux disease)   . History of blood transfusion 03/09/2018   "low HgB"  . Hypertension    Review of Systems:   Constitutional: Negative for chills and fever.  Respiratory: Negative for shortness of breath.   Cardiovascular: Negative for chest pain and leg swelling.  Gastrointestinal: Negative for abdominal pain, nausea and vomiting.  Neurological: Negative for dizziness and headaches.   Physical Exam:  Vitals:   04/09/21 1006 04/09/21 1012  BP: 140/84 136/86  Pulse: 68 69  Temp: 98.9 F (37.2 C)   TempSrc: Oral   SpO2: 99%   Weight: 194 lb 8 oz (88.2 kg)   Height: 5\' 6"  (1.676 m)    Physical Exam HENT:     Head: Normocephalic and atraumatic.  Eyes:     Conjunctiva/sclera: Conjunctivae normal.     Pupils: Pupils are equal, round, and reactive to light.  Neck:     Thyroid: No thyromegaly.  Cardiovascular:     Rate and Rhythm: Normal rate and regular rhythm.     Heart sounds: Normal heart sounds. No murmur heard. No friction rub. No gallop.   Pulmonary:     Effort: Pulmonary effort is normal. No respiratory distress.     Breath sounds: Normal breath sounds. No wheezing.  Abdominal:     General: Bowel sounds are normal. There is no distension.     Palpations: Abdomen is soft.  Musculoskeletal:        General: Normal range of motion.     Cervical back: Normal range of motion and neck supple.  Skin:    General: Skin is warm and dry.     Findings: No erythema.  Neurological:     Mental Status: She is alert and oriented to person, place, and time.     Gait: Gait is intact.  Psychiatric:        Mood and Affect: Mood and affect normal.       Assessment & Plan:   See Encounters Tab for problem based charting.  Patient discussed with Dr. Jimmye Norman

## 2021-04-09 ENCOUNTER — Other Ambulatory Visit: Payer: Self-pay

## 2021-04-09 ENCOUNTER — Encounter: Payer: Self-pay | Admitting: Internal Medicine

## 2021-04-09 ENCOUNTER — Ambulatory Visit (INDEPENDENT_AMBULATORY_CARE_PROVIDER_SITE_OTHER): Payer: Medicare Other | Admitting: Internal Medicine

## 2021-04-09 VITALS — BP 136/86 | HR 69 | Temp 98.9°F | Ht 66.0 in | Wt 194.5 lb

## 2021-04-09 DIAGNOSIS — J302 Other seasonal allergic rhinitis: Secondary | ICD-10-CM

## 2021-04-09 DIAGNOSIS — E7800 Pure hypercholesterolemia, unspecified: Secondary | ICD-10-CM | POA: Diagnosis not present

## 2021-04-09 DIAGNOSIS — Z13820 Encounter for screening for osteoporosis: Secondary | ICD-10-CM | POA: Diagnosis not present

## 2021-04-09 DIAGNOSIS — Z780 Asymptomatic menopausal state: Secondary | ICD-10-CM

## 2021-04-09 DIAGNOSIS — Z23 Encounter for immunization: Secondary | ICD-10-CM | POA: Diagnosis not present

## 2021-04-09 DIAGNOSIS — Z1231 Encounter for screening mammogram for malignant neoplasm of breast: Secondary | ICD-10-CM | POA: Diagnosis not present

## 2021-04-09 DIAGNOSIS — D509 Iron deficiency anemia, unspecified: Secondary | ICD-10-CM | POA: Diagnosis not present

## 2021-04-09 DIAGNOSIS — N1831 Chronic kidney disease, stage 3a: Secondary | ICD-10-CM

## 2021-04-09 DIAGNOSIS — E876 Hypokalemia: Secondary | ICD-10-CM | POA: Diagnosis not present

## 2021-04-09 DIAGNOSIS — I10 Essential (primary) hypertension: Secondary | ICD-10-CM | POA: Diagnosis present

## 2021-04-09 DIAGNOSIS — Z0000 Encounter for general adult medical examination without abnormal findings: Secondary | ICD-10-CM

## 2021-04-09 NOTE — Assessment & Plan Note (Addendum)
Patient has a history of iron deficiency anemia and has been on and off her iron supplementation, she is now only taking over-the-counter medications.  She is currently postmenopausal and has no symptoms of bleeding.  Had a colonoscopy in 2019 that showed some polyps.  No evidence of anemia on exam.   -Repeat iron studies today -Repeat CBC -May need further work-up if iron studies continue to be low  Addendum: Labs showed hemoglobin 13.  Ferritin 155, Iron 96, TIBC 312.  No evidence of iron deficiency anemia at this time.  Advised patient that iron supplementation is not required at this time.

## 2021-04-09 NOTE — Assessment & Plan Note (Signed)
She is on labetalol 100 mg BID and amlodipine daily. Reports taking her medications as prescribed. Denies any side effects including vision changes, headaches, lightheadedness, dizziness, chest pain, shortness of breath, or lower extremity swelling.  Blood pressure today is 140/84, repeat was 136/86.  She reports checking her blood pressures at home and it is around 130-140/80.  We discussed that she is mildly above goal however if she would like we can make some lifestyle adjustments and follow up in 3 months.   -Refill labetalol 100 mg BID -Refill amlodipine 10 mg daily -Provided DASH diet information -Check BMP today -RTC in 3 months

## 2021-04-09 NOTE — Assessment & Plan Note (Signed)
Patient received pneumonia vaccine today.  Ordered mammogram and DEXA scan for screening.

## 2021-04-09 NOTE — Patient Instructions (Signed)
[name],  It was a pleasure to see you today. Thank you for coming in.   Today we discussed your blood pressure.  This was a little above your goal blood pressure.  You can continue taking your current medications.  Please start exercising more frequently, the goal is to 150 minutes/week, this is about 5 30 minutes sessions.  I have also provided some information on the DASH diet.  We will have a follow-up in 3 months and if her blood pressure remains elevated we can discuss adding additional medications.  We also discussed your cholesterol, we are repeating some labs today and I will call you if you need to restart the atorvastatin.  In regards to your iron deficiency, I am repeating some labs today, if you are still iron deficient I will send in a prescription for iron supplementation.  I have ordered a DEXA scan and a mammogram.  You have also received the pneumonia vaccine.  Please return to clinic in 3 months or sooner if needed.   Thank you again for coming in.   Kayla Williams.D.

## 2021-04-09 NOTE — Assessment & Plan Note (Signed)
History of hypokalemia noted on prior labs, she had previously been on chlorthalidone and potassium supplementation.  She is not on any supplementation at this time.  She reports at least a banana every day. -Repeat BMP today

## 2021-04-09 NOTE — Assessment & Plan Note (Signed)
Continues to endorse seasonal allergies with itchy eyes and teary eyes when the pollen is high.  She denies any symptoms at this time.  She is only taking cetirizine as needed when this occurs.  She is not requiring any medications at this time.  -Continue cetirizine as needed

## 2021-04-09 NOTE — Assessment & Plan Note (Addendum)
Patient had previously been on rosuvastatin 20 mg daily for any ASCVD risk of 14%.  She states that she took the medication for a few months however stopped, she is not exactly sure why however thinks that it may have caused some muscle pains.  She has lost about 10 pounds over the past year and states that she has made some dietary changes.  We discussed that we can repeat a lipid panel today and recalculate the ASCVD risk score to see if she still needs it, if she did she would be agreeable to retrying the atorvastatin.  -Check lipid panel today -Remove atorvastatin from medication list for now  Addendum: Repeat lipid panel showed cholesterol 249, HDL 41, LDL 183.  ASCVD risk score of 15.3, in the intermediate range.  Contacted patient with results and recommended resuming a low-dose/moderate dose statin.  She is agreeable to retrying this medication, advised that if she develops muscle pains or side effects to the medication to contact us, and that we can try a different statin medication if this occurs. -Restart atorvastatin 20 mg daily -Consider repeating lipid panel in 3 months

## 2021-04-10 ENCOUNTER — Other Ambulatory Visit: Payer: Self-pay

## 2021-04-10 DIAGNOSIS — I10 Essential (primary) hypertension: Secondary | ICD-10-CM

## 2021-04-10 LAB — LIPID PANEL
Chol/HDL Ratio: 4.9 ratio — ABNORMAL HIGH (ref 0.0–4.4)
Cholesterol, Total: 249 mg/dL — ABNORMAL HIGH (ref 100–199)
HDL: 51 mg/dL (ref >39)
LDL Chol Calc (NIH): 183 mg/dL — ABNORMAL HIGH (ref 0–99)
Triglycerides: 88 mg/dL (ref 0–149)
VLDL Cholesterol Cal: 15 mg/dL (ref 5–40)

## 2021-04-10 LAB — BMP8+ANION GAP
Anion Gap: 16 mmol/L (ref 10.0–18.0)
BUN/Creatinine Ratio: 13 (ref 12–28)
BUN: 15 mg/dL (ref 8–27)
CO2: 23 mmol/L (ref 20–29)
Calcium: 9.7 mg/dL (ref 8.7–10.3)
Chloride: 102 mmol/L (ref 96–106)
Creatinine, Ser: 1.15 mg/dL — ABNORMAL HIGH (ref 0.57–1.00)
Glucose: 92 mg/dL (ref 65–99)
Potassium: 3.8 mmol/L (ref 3.5–5.2)
Sodium: 141 mmol/L (ref 134–144)
eGFR: 52 mL/min/{1.73_m2} — ABNORMAL LOW (ref >59)

## 2021-04-10 LAB — CBC
Hematocrit: 39.7 % (ref 34.0–46.6)
Hemoglobin: 13.2 g/dL (ref 11.1–15.9)
MCH: 27.4 pg (ref 26.6–33.0)
MCHC: 33.2 g/dL (ref 31.5–35.7)
MCV: 83 fL (ref 79–97)
Platelets: 363 10*3/uL (ref 150–450)
RBC: 4.81 x10E6/uL (ref 3.77–5.28)
RDW: 14.4 % (ref 11.7–15.4)
WBC: 3.8 10*3/uL (ref 3.4–10.8)

## 2021-04-10 LAB — IRON AND TIBC
Iron Saturation: 31 % (ref 15–55)
Iron: 96 ug/dL (ref 27–139)
Total Iron Binding Capacity: 312 ug/dL (ref 250–450)
UIBC: 216 ug/dL (ref 118–369)

## 2021-04-10 LAB — FERRITIN: Ferritin: 155 ng/mL — ABNORMAL HIGH (ref 15–150)

## 2021-04-10 NOTE — Telephone Encounter (Signed)
amLODipine (NORVASC) 10 MG tablet, refill request @  [facility] ([place]), [place] - [address] Phone:  [phone]  Fax:  [phone]

## 2021-04-11 MED ORDER — AMLODIPINE BESYLATE 10 MG PO TABS: 10.0000 mg | ORAL_TABLET | Freq: Every day | ORAL | 3 refills | Status: DC

## 2021-04-13 NOTE — Progress Notes (Signed)
Internal Medicine Clinic Attending ° °Case discussed with Dr. Krienke  At the time of the visit.  We reviewed the resident’s history and exam and pertinent patient test results.  I agree with the assessment, diagnosis, and plan of care documented in the resident’s note.  °

## 2021-04-15 DIAGNOSIS — N183 Chronic kidney disease, stage 3 unspecified: Secondary | ICD-10-CM | POA: Insufficient documentation

## 2021-04-15 MED ORDER — ATORVASTATIN CALCIUM 20 MG PO TABS: 20.0000 mg | ORAL_TABLET | Freq: Every day | ORAL | 5 refills | Status: DC

## 2021-04-15 NOTE — Addendum Note (Signed)
Addended by: [name] on: 04/15/2021 10:34 AM   Modules accepted: Orders

## 2021-04-15 NOTE — Assessment & Plan Note (Signed)
Unclear etiology, could be secondary to hypertension.  Creatinine stable at 1.15 with a GFR of 52.  Continue to monitor.

## 2021-05-04 ENCOUNTER — Encounter: Payer: Self-pay | Admitting: *Deleted

## 2021-06-03 ENCOUNTER — Encounter: Payer: Self-pay | Admitting: *Deleted

## 2021-07-01 ENCOUNTER — Other Ambulatory Visit: Payer: Self-pay | Admitting: Internal Medicine

## 2021-07-01 DIAGNOSIS — I10 Essential (primary) hypertension: Secondary | ICD-10-CM

## 2021-07-01 DIAGNOSIS — E7800 Pure hypercholesterolemia, unspecified: Secondary | ICD-10-CM

## 2021-07-01 MED ORDER — LABETALOL HCL 100 MG PO TABS: 100.0000 mg | ORAL_TABLET | Freq: Two times a day (BID) | ORAL | 0 refills | Status: DC

## 2021-07-15 ENCOUNTER — Other Ambulatory Visit: Payer: Self-pay | Admitting: *Deleted

## 2021-07-15 DIAGNOSIS — Z1231 Encounter for screening mammogram for malignant neoplasm of breast: Secondary | ICD-10-CM

## 2021-10-01 ENCOUNTER — Other Ambulatory Visit: Payer: Self-pay | Admitting: Internal Medicine

## 2021-10-01 DIAGNOSIS — I10 Essential (primary) hypertension: Secondary | ICD-10-CM

## 2021-10-29 ENCOUNTER — Encounter: Payer: Medicare Other | Admitting: Internal Medicine

## 2021-12-28 ENCOUNTER — Other Ambulatory Visit: Payer: Self-pay | Admitting: Internal Medicine

## 2021-12-28 DIAGNOSIS — I10 Essential (primary) hypertension: Secondary | ICD-10-CM

## 2021-12-30 NOTE — Telephone Encounter (Signed)
Called and spoke with patient. She plan to follow-up in clinic within a month. I will send a message to front desk to request help with scheduling.

## 2022-01-09 ENCOUNTER — Other Ambulatory Visit: Payer: Self-pay

## 2022-01-09 DIAGNOSIS — I10 Essential (primary) hypertension: Secondary | ICD-10-CM

## 2022-01-09 MED ORDER — AMLODIPINE BESYLATE 10 MG PO TABS: 10.0000 mg | ORAL_TABLET | Freq: Every day | ORAL | 3 refills | Status: DC

## 2022-01-09 NOTE — Telephone Encounter (Signed)
Katharine Look with [facility] is requesting another doctor to send Rx on amLODipine (NORVASC) 10 MG tablet. Pt is at the store waiting for the medication.

## 2022-01-29 ENCOUNTER — Encounter: Payer: Medicare Other | Admitting: Internal Medicine

## 2022-01-29 NOTE — Progress Notes (Deleted)
HTN: ?Labetalol 100, amlodipine 10 ?-BMP ? ?Allergies: ?Cetirizine 10 ? ?HLD: ?Atorvastatin 20 mg ?The 10-year ASCVD risk score Mikey Bussing DC Jr., et al., 2013) is: % ?  Values used to calculate the score: ?    Age:  years ?    Sex:  ?    Is Non-Hispanic African American:  ?    Diabetic:  ?    Tobacco smoker:  ?    Systolic Blood Pressure: mmHg ?    Is BP treated:  ?    HDL Cholesterol: mg/dL ?    Total Cholesterol:  mg/dL   ? ?GERD: ?Omeprazole 20 ? ?Iron deficiency anemia: ?Iron supplement ?5/22 Ferritin 155, TIBC 312,  ? ?CKD- y no ARB? ? ?Care gaps: ?-Shringrix, flu,covid ?-mammogram ?

## 2022-02-09 ENCOUNTER — Ambulatory Visit (INDEPENDENT_AMBULATORY_CARE_PROVIDER_SITE_OTHER): Payer: Medicare Other | Admitting: Internal Medicine

## 2022-02-09 VITALS — BP 158/80 | HR 65 | Temp 98.5°F | Wt 180.1 lb

## 2022-02-09 DIAGNOSIS — I10 Essential (primary) hypertension: Secondary | ICD-10-CM

## 2022-02-09 DIAGNOSIS — E2839 Other primary ovarian failure: Secondary | ICD-10-CM | POA: Diagnosis not present

## 2022-02-09 DIAGNOSIS — E785 Hyperlipidemia, unspecified: Secondary | ICD-10-CM

## 2022-02-09 DIAGNOSIS — Z1231 Encounter for screening mammogram for malignant neoplasm of breast: Secondary | ICD-10-CM | POA: Diagnosis not present

## 2022-02-09 DIAGNOSIS — N1831 Chronic kidney disease, stage 3a: Secondary | ICD-10-CM | POA: Diagnosis not present

## 2022-02-09 DIAGNOSIS — R7303 Prediabetes: Secondary | ICD-10-CM

## 2022-02-09 DIAGNOSIS — R739 Hyperglycemia, unspecified: Secondary | ICD-10-CM | POA: Diagnosis not present

## 2022-02-09 DIAGNOSIS — I129 Hypertensive chronic kidney disease with stage 1 through stage 4 chronic kidney disease, or unspecified chronic kidney disease: Secondary | ICD-10-CM

## 2022-02-09 DIAGNOSIS — E669 Obesity, unspecified: Secondary | ICD-10-CM

## 2022-02-09 DIAGNOSIS — Z13820 Encounter for screening for osteoporosis: Secondary | ICD-10-CM | POA: Diagnosis not present

## 2022-02-09 DIAGNOSIS — Z0000 Encounter for general adult medical examination without abnormal findings: Secondary | ICD-10-CM

## 2022-02-09 MED ORDER — AMLODIPINE BESYLATE 10 MG PO TABS: 10.0000 mg | ORAL_TABLET | Freq: Every day | ORAL | 3 refills | Status: DC

## 2022-02-09 MED ORDER — LABETALOL HCL 100 MG PO TABS: 100.0000 mg | ORAL_TABLET | Freq: Two times a day (BID) | ORAL | 2 refills | Status: DC

## 2022-02-09 NOTE — Patient Instructions (Addendum)
Kayla Williams, it was a pleasure seeing you today! ? ?Today we discussed: ?Blood pressure/ Kidney disease ?Your blood pressure was elevated today at 158/80. I want to start a medication called Losartan. This medication is great for blood pressure and is also protects your kidneys so would be good in two ways. I want to check some blood work before starting this medication. It is important to control your blood pressure to prevent worsening of your kidney disease. ? ?Cholesterol ?I am rechecking your cholesterol today. If it is elevated still we may talk about starting a medication called Zetia. I will reach out with those results. ? ?Preventative screening ?Great job in staying up to date with mammogram and getting bone scan (screening for osteoporosis or bone thinning). I am checking some blood work for blood sugar as well. ? ?You will be due for repeat colonoscopy next year 05/2023. ? ?I have ordered the following labs today: ? ?Lab Orders    ?     BMP8+Anion Gap    ?     Lipid Profile    ?     Hemoglobin A1c    ?  ? ?I will call if any are abnormal. All of your labs can be accessed through "My Chart" ?  ?My Chart Access: ?[email or web address] ? ? ?Referrals ordered today:  ? ?Referral Orders  ?No referral(s) requested today  ?  ? ?I have ordered the following medication/changed the following medications:  ? ?Stop the following medications: ?Medications Discontinued During This Encounter  ?Medication Reason  ? labetalol (NORMODYNE) 100 MG tablet Reorder  ? amLODipine (NORVASC) 10 MG tablet Reorder  ? Cetirizine HCl 10 MG CAPS Change in therapy  ? atorvastatin (LIPITOR) 20 MG tablet Non-compliance  ? omeprazole (PRILOSEC) 20 MG capsule Completed Course  ?  ? ?Start the following medications: ?Meds ordered this encounter  ?Medications  ? amLODipine (NORVASC) 10 MG tablet  ?  Sig: Take 1 tablet (10 mg total) by mouth daily.  ?  Dispense:  90 tablet  ?  Refill:  3  ?  labetalol (NORMODYNE) 100 MG tablet  ?  Sig: Take 1 tablet (100 mg total) by mouth 2 (two) times daily.  ?  Dispense:  180 tablet  ?  Refill:  2  ?  ? ?Follow-up:  1 month   ? ?Please make sure to arrive 15 minutes prior to your next appointment. If you arrive late, you may be asked to reschedule.  ? ?We look forward to seeing you next time. Please call our clinic at [phone] if you have any questions or concerns. The best time to call is Monday-Friday from 9am-4pm, but there is someone available 24/7. If after hours or the weekend, call the main hospital number and ask for the Internal Medicine Resident On-Call. If you need medication refills, please notify your pharmacy one week in advance and they will send [place] a request. ? ?Thank you for letting us take part in your care. Wishing you the best! ? ?Thank you, ?Dr. Howie Ill ?[place]  ?

## 2022-02-09 NOTE — Progress Notes (Unsigned)
HTN Medications: labetalol 200 mg, amlodipine 10 mg, Initially 156/82, then 158/80 on repeat. Patient states that she takes her medication daily and misses few doses per week.  A/P: Check BMP, then start losartan  CKD stage 3 A/P: BMP  HLD Medications: atorvastatin 20 mg (not filled recently) A/P: Repeat lipid panel, last ASCVD risk of 15.3, total 249, HDL 41, LDL 183  GERD  Care gaps: Shingrix Dexa and mammogram completion??? [place] colonoscopy had polyps, repeat in 06/24

## 2022-02-10 DIAGNOSIS — R7303 Prediabetes: Secondary | ICD-10-CM | POA: Insufficient documentation

## 2022-02-10 LAB — BMP8+ANION GAP
Anion Gap: 14 mmol/L (ref 10.0–18.0)
BUN/Creatinine Ratio: 13 (ref 12–28)
BUN: 13 mg/dL (ref 8–27)
CO2: 24 mmol/L (ref 20–29)
Calcium: 9.9 mg/dL (ref 8.7–10.3)
Chloride: 103 mmol/L (ref 96–106)
Creatinine, Ser: 0.98 mg/dL (ref 0.57–1.00)
Glucose: 94 mg/dL (ref 70–99)
Potassium: 4.3 mmol/L (ref 3.5–5.2)
Sodium: 141 mmol/L (ref 134–144)
eGFR: 62 mL/min/{1.73_m2} (ref >59)

## 2022-02-10 LAB — LIPID PANEL
Chol/HDL Ratio: 4.4 ratio (ref 0.0–4.4)
Cholesterol, Total: 240 mg/dL — ABNORMAL HIGH (ref 100–199)
HDL: 54 mg/dL (ref >39)
LDL Chol Calc (NIH): 168 mg/dL — ABNORMAL HIGH (ref 0–99)
Triglycerides: 104 mg/dL (ref 0–149)
VLDL Cholesterol Cal: 18 mg/dL (ref 5–40)

## 2022-02-10 LAB — HEMOGLOBIN A1C
Est. average glucose Bld gHb Est-mCnc: 120 mg/dL
Hgb A1c MFr Bld: 5.8 % — ABNORMAL HIGH (ref 4.8–5.6)

## 2022-02-10 MED ORDER — EZETIMIBE 10 MG PO TABS: 10.0000 mg | ORAL_TABLET | Freq: Every day | ORAL | 11 refills | Status: DC

## 2022-02-10 MED ORDER — LOSARTAN POTASSIUM 25 MG PO TABS: 25.0000 mg | ORAL_TABLET | Freq: Every day | ORAL | 11 refills | Status: DC

## 2022-02-10 NOTE — Assessment & Plan Note (Signed)
Medications: atorvastatin 20 mg (not filled recently) ?A/P: ?ASCVD risk of 20%. LDL 168. Patient is unsure if she had difficulty tolerating statin, but does not want to take because of reports from several family members. We talked about trying zetia if cholesterol remained high and medication name included on AVS for patient to research once she was home. ? ?ADDENDUM 3/14: ?Patient would prefer to avoid statins at this time. She is ok with starting zetia. ?-start zetia 10 mg ?

## 2022-02-10 NOTE — Assessment & Plan Note (Signed)
A/P: ?BMP showed improved creatinine and GFR. ?

## 2022-02-10 NOTE — Assessment & Plan Note (Addendum)
Medications: labetalol 200 mg, amlodipine 10 mg, ?Initially 156/82, then 158/80 on repeat. Patient states that she takes her medication daily and misses few doses per week.  ?A/P: ?BMP showed improvement in creatinine and GFR from 10 months ago.  ?-refilled amlodipine and metoprolol ?-start losartan and have patient follow-up in 4 weeks. ?-repeat BMP in 4 weeks ?

## 2022-02-10 NOTE — Progress Notes (Signed)
Internal Medicine Clinic Attending  Case discussed with Dr. Masters  At the time of the visit.  We reviewed the resident's history and exam and pertinent patient test results.  I agree with the assessment, diagnosis, and plan of care documented in the resident's note.  

## 2022-02-10 NOTE — Assessment & Plan Note (Signed)
Hgb A1c from 5.9 to 5.8. Called and spoke with patient. She states that she is working on [employer] and losing weight. She has intentionally lost about 15 lbs in the last year with diet changes. ?

## 2022-02-10 NOTE — Assessment & Plan Note (Signed)
Patient planning to call to schedule mammogram and DEXA scan. ?

## 2022-03-18 ENCOUNTER — Ambulatory Visit
Admission: RE | Admit: 2022-03-18 | Discharge: 2022-03-18 | Disposition: A | Discharge status: Home / Self Care | Payer: Medicare Other | Source: Ambulatory Visit | Attending: Internal Medicine | Admitting: Internal Medicine

## 2022-03-18 DIAGNOSIS — Z1231 Encounter for screening mammogram for malignant neoplasm of breast: Secondary | ICD-10-CM

## 2022-03-19 ENCOUNTER — Ambulatory Visit (INDEPENDENT_AMBULATORY_CARE_PROVIDER_SITE_OTHER): Payer: Medicare Other | Admitting: Internal Medicine

## 2022-03-19 ENCOUNTER — Encounter: Payer: Self-pay | Admitting: Internal Medicine

## 2022-03-19 VITALS — BP 114/68 | HR 67 | Temp 97.9°F | Ht 66.0 in | Wt 179.8 lb

## 2022-03-19 DIAGNOSIS — I10 Essential (primary) hypertension: Secondary | ICD-10-CM

## 2022-03-19 NOTE — Patient Instructions (Addendum)
Thank you, [name] for allowing [place] to provide your care today. Today we discussed blood pressure.   ? ?Labs/Tests Ordered: ?Lab Orders  ?No laboratory test(s) ordered today  ?  ? ?Referrals Ordered:  ?Referral Orders  ?No referral(s) requested today  ?  ? ?Medication Changes:  ?There are no discontinued medications.  ? ?No orders of the defined types were placed in this encounter. ?  ? ?Health Maintenance Screening: ?There are no preventive care reminders to display for this patient.  ? ?Instructions:  ?- See DASH diet handout below ? ?Follow up: 6 months  ? ?Remember: If you have any questions or concerns, call our clinic at [phone] or after hours call [phone] and ask for the internal medicine resident on call. ? ?Kayla Williams, D.O. ?[place] ? ?

## 2022-03-19 NOTE — Progress Notes (Signed)
? ? ?  Subjective:  ?CC: HTN ? ?HPI: ? ?Kayla Williams is a 71 y.o. female with a past medical history stated below and presents today for HTN. Please see problem based assessment and plan for additional details. ? ?Past Medical History:  ?Diagnosis Date  ? Anemia   ? 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 "growth in right foot"  ? GERD (gastroesophageal reflux disease)   ? History of blood transfusion 03/09/2018  ? "low HgB"  ? Hypertension   ? ? ?Current Outpatient Medications on File Prior to Visit  ?Medication Sig Dispense Refill  ? amLODipine (NORVASC) 10 MG tablet Take 1 tablet (10 mg total) by mouth daily. 90 tablet 3  ? ezetimibe (ZETIA) 10 MG tablet Take 1 tablet (10 mg total) by mouth daily. 30 tablet 11  ? iron polysaccharides (FERREX 150) 150 MG capsule Take 1 capsule (150 mg total) by mouth 2 (two) times daily. 90 capsule 3  ? labetalol (NORMODYNE) 100 MG tablet Take 1 tablet (100 mg total) by mouth 2 (two) times daily. 180 tablet 2  ? losartan (COZAAR) 25 MG tablet Take 1 tablet (25 mg total) by mouth daily. 30 tablet 11  ? ?No current facility-administered medications on file prior to visit.  ? ? ?No family history on file. ? ?Social History  ? ?Socioeconomic History  ? Marital status: Widowed  ?  Spouse name: Not on file  ? Number of children: Not on file  ? Years of education: Not on file  ? Highest education level: Not on file  ?Occupational History  ? Not on file  ?Tobacco Use  ? Smoking status: Never  ? Smokeless tobacco: Never  ?Vaping Use  ? Vaping Use: Never used  ?Substance and Sexual Activity  ? Alcohol use: Not Currently  ? Drug use: Never  ? Sexual activity: Yes  ?Other Topics Concern  ? Not on file  ?Social History Narrative  ? Not on file  ? ?Social Determinants of Health  ? ?Financial Resource Strain: Not on file  ?Food Insecurity: Not on file  ?Transportation Needs: Not on file  ?Physical Activity: Not on file  ?Stress: Not on file  ?Social Connections: Not on file   ?Intimate Partner Violence: Not on file  ? ? ?Review of Systems: ?ROS negative except for what is noted on the assessment and plan. ? ?Objective:  ?There were no vitals filed for this visit. ? ?Physical Exam: ?Gen: A&O x3 and in no apparent distress, well appearing and nourished. ?HEENT:  ?  Head - normocephalic, atraumatic.  ?  Eye - visual acuity grossly intact, conjunctiva clear, sclera non-icteric, EOM intact.  ?CV: RRR, no murmurs, S1/S2 presents  ?Resp: Clear to ascultation bilaterally  ?MSK: Grossly normal AROM and strength x4 extremities. ?Skin: good skin turgor, no rashes, unusual bruising, or prominent lesions.  ?Neuro: No focal deficits, grossly normal sensation and coordination.  ?Psych: Oriented x3 and responding appropriately. Intact memory, normal mood, judgement, affect, and insight.  ? ? ?Assessment & Plan:  ?See Encounters Tab for problem based charting. ? ?Patient discussed with Dr. Angelia Mould ? ? ?Marianna Payment, D.O. ?[facility]  PGY-3 ?Pager: [phone]  Phone: [phone] ?Date 03/19/2022  Time 1:14 PM ? ?

## 2022-03-19 NOTE — Assessment & Plan Note (Addendum)
HPI: ?Patient presents for reevaluation for her blood pressure after starting losartan.  She is currently on labetalol 200 mg twice daily, amlodipine 10 mg, and losartan 25 mg.  States that she is tolerating this medication well without side effects.  Current blood pressures listed below. ? ?BP Readings from Last 3 Encounters:  ?03/19/22 114/68  ?02/09/22 (!) 158/80  ?04/09/21 136/86  ? ?A/p: ?Poorly controlled hypertension ?-We will get a BMP today ?-Continue current hypertensive medication management ?- Encouraged DASH diet - gave handout ?

## 2022-03-20 LAB — BMP8+ANION GAP
Anion Gap: 16 mmol/L (ref 10.0–18.0)
BUN/Creatinine Ratio: 14 (ref 12–28)
BUN: 18 mg/dL (ref 8–27)
CO2: 23 mmol/L (ref 20–29)
Calcium: 9.9 mg/dL (ref 8.7–10.3)
Chloride: 103 mmol/L (ref 96–106)
Creatinine, Ser: 1.26 mg/dL — ABNORMAL HIGH (ref 0.57–1.00)
Glucose: 84 mg/dL (ref 70–99)
Potassium: 4 mmol/L (ref 3.5–5.2)
Sodium: 142 mmol/L (ref 134–144)
eGFR: 46 mL/min/{1.73_m2} — ABNORMAL LOW (ref >59)

## 2022-03-20 NOTE — Progress Notes (Signed)
Internal Medicine Clinic Attending  Case discussed with Dr. Coe  At the time of the visit.  We reviewed the resident's history and exam and pertinent patient test results.  I agree with the assessment, diagnosis, and plan of care documented in the resident's note.  

## 2022-09-01 ENCOUNTER — Ambulatory Visit
Admission: RE | Admit: 2022-09-01 | Discharge: 2022-09-01 | Disposition: A | Discharge status: Home / Self Care | Payer: Medicare Other | Source: Ambulatory Visit | Attending: Internal Medicine | Admitting: Internal Medicine

## 2022-09-01 DIAGNOSIS — E2839 Other primary ovarian failure: Secondary | ICD-10-CM

## 2022-09-01 DIAGNOSIS — Z13820 Encounter for screening for osteoporosis: Secondary | ICD-10-CM

## 2022-09-04 ENCOUNTER — Encounter: Payer: Self-pay | Admitting: Internal Medicine

## 2022-09-04 NOTE — Progress Notes (Signed)
DEXA scan indicates that patient has osteopenia. She would benefit from weight bearing physical activity as well as adequate intake of calcium and Vitamin D. No indication for bisphosphonate at this time. Unable to reach patient by phone to discuss results. Letter sent.

## 2022-09-30 ENCOUNTER — Other Ambulatory Visit: Payer: Self-pay | Admitting: Internal Medicine

## 2022-09-30 DIAGNOSIS — I10 Essential (primary) hypertension: Secondary | ICD-10-CM

## 2023-01-31 ENCOUNTER — Other Ambulatory Visit: Payer: Self-pay | Admitting: Internal Medicine

## 2023-01-31 DIAGNOSIS — I10 Essential (primary) hypertension: Secondary | ICD-10-CM

## 2023-01-31 NOTE — Progress Notes (Unsigned)
   CC: 6 month follow up  HPI:  Ms.Analeya J Skiba is a 72 y.o. with medical history of HTN, GERD, and IDA presenting to [facility] for a 6 month month follow up.   Please see problem-based list for further details, assessments, and plans.  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   ezetimibe (ZETIA) 10 MG tablet, Take 1 tablet (10 mg total) by mouth daily.   labetalol (NORMODYNE) 100 MG tablet, Take 1 tablet by mouth twice daily   losartan (COZAAR) 25 MG tablet, Take 1 tablet (25 mg total) by mouth daily.    Current Outpatient Medications (Hematological):    iron polysaccharides (FERREX 150) 150 MG capsule, Take 1 capsule (150 mg total) by mouth 2 (two) times daily.   Review of Systems:  Review of system negative unless stated in the problem list or HPI.    Physical Exam:  Vitals:   02/01/23 1048  BP: 118/66  Pulse: 63  Resp: (!) 28  Temp: 98.1 F (36.7 C)  TempSrc: Oral  SpO2: 99%  Weight: 171 lb 8 oz (77.8 kg)  Height: '5\' 6"'$  (1.676 m)    Physical Exam General: NAD HENT: NCAT Lungs: CTAB, no wheeze, rhonchi or rales.  Cardiovascular: Normal heart sounds, no r/m/g, 2+ pulses in all extremities. No LE edema Abdomen: No TTP, normal bowel sounds MSK: No asymmetry or muscle atrophy.  Skin: no lesions noted on exposed skin Neuro: Alert and oriented x4. CN grossly intact Psych: Normal mood and normal affect   Assessment & Plan:   No problem-specific Assessment & Plan notes found for this encounter.   See Encounters Tab for problem based charting.  Patient Discussed with Dr.  {NAMES:[id number]::"Guilloud","Hoffman","Mullen","Narendra","Vincent","Machen","Lau","Hatcher","Williams"} Idamae Schuller, MD Tillie Rung. [facility] Internal Medicine Residency, PGY-2   HTN On Amlodipine 5 mg qd, labetalol 100 mg BID, Losartan 25 mg qd. Last renal fxn panel with elevated Cr. Repeat BMP this visit. Can increase losartan and dc labetalol.  BMP today.   HLD Lipid panel with LDL on 168. On Zetia 10 mg. Repeat lipid panel.   Anemia Was taken iron due to IDA. Will recheck.   Prediabetes A1c 5.8 one year ago. Repeat A1c.  Dexa scan osteopenia. Vtaimin level check.

## 2023-02-01 ENCOUNTER — Ambulatory Visit (INDEPENDENT_AMBULATORY_CARE_PROVIDER_SITE_OTHER): Payer: Medicare Other | Admitting: Internal Medicine

## 2023-02-01 ENCOUNTER — Encounter: Payer: Self-pay | Admitting: Internal Medicine

## 2023-02-01 ENCOUNTER — Other Ambulatory Visit: Payer: Self-pay

## 2023-02-01 ENCOUNTER — Ambulatory Visit (INDEPENDENT_AMBULATORY_CARE_PROVIDER_SITE_OTHER): Payer: Medicare Other

## 2023-02-01 VITALS — BP 118/66 | HR 63 | Temp 98.1°F | Resp 28 | Ht 66.0 in | Wt 171.5 lb

## 2023-02-01 VITALS — BP 118/89 | HR 63 | Temp 98.1°F | Resp 28 | Ht 66.0 in | Wt 171.5 lb

## 2023-02-01 DIAGNOSIS — Z89431 Acquired absence of right foot: Secondary | ICD-10-CM | POA: Diagnosis not present

## 2023-02-01 DIAGNOSIS — C4921 Malignant neoplasm of connective and soft tissue of right lower limb, including hip: Secondary | ICD-10-CM

## 2023-02-01 DIAGNOSIS — K635 Polyp of colon: Secondary | ICD-10-CM

## 2023-02-01 DIAGNOSIS — Z0000 Encounter for general adult medical examination without abnormal findings: Secondary | ICD-10-CM

## 2023-02-01 DIAGNOSIS — D509 Iron deficiency anemia, unspecified: Secondary | ICD-10-CM | POA: Diagnosis not present

## 2023-02-01 DIAGNOSIS — R7303 Prediabetes: Secondary | ICD-10-CM

## 2023-02-01 DIAGNOSIS — E7800 Pure hypercholesterolemia, unspecified: Secondary | ICD-10-CM

## 2023-02-01 DIAGNOSIS — M8580 Other specified disorders of bone density and structure, unspecified site: Secondary | ICD-10-CM

## 2023-02-01 DIAGNOSIS — E785 Hyperlipidemia, unspecified: Secondary | ICD-10-CM

## 2023-02-01 DIAGNOSIS — E559 Vitamin D deficiency, unspecified: Secondary | ICD-10-CM

## 2023-02-01 DIAGNOSIS — S98911D Complete traumatic amputation of right foot, level unspecified, subsequent encounter: Secondary | ICD-10-CM

## 2023-02-01 DIAGNOSIS — I10 Essential (primary) hypertension: Secondary | ICD-10-CM | POA: Diagnosis present

## 2023-02-01 NOTE — Patient Instructions (Signed)
Ms.Belissa Seleta Rhymes, it was a pleasure seeing you today! You endorsed feeling well today. Below are some of the things we talked about this visit. We look forward to seeing you in the follow up appointment!  Today we discussed: We will get lab work today. Depending on the lab work we will make changes to your blood pressure medications and your cholesterol medications.  I will refer you to podiatry for new prosthesis.  I will also place a referral to GI for repeat colonoscopy.   I have ordered the following labs today:   Lab Orders         BMP8+Anion Gap         Lipid Profile         Hemoglobin A1c         Vitamin D (25 hydroxy)         CBC no Diff       Referrals ordered today:   Referral Orders  No referral(s) requested today     I have ordered the following medication/changed the following medications:   Stop the following medications: There are no discontinued medications.   Start the following medications: No orders of the defined types were placed in this encounter.    Follow-up: 1 month follow up  Please make sure to arrive 15 minutes prior to your next appointment. If you arrive late, you may be asked to reschedule.   We look forward to seeing you next time. Please call our clinic at [phone] if you have any questions or concerns. The best time to call is Monday-Friday from 9am-4pm, but there is someone available 24/7. If after hours or the weekend, call the main hospital number and ask for the Internal Medicine Resident On-Call. If you need medication refills, please notify your pharmacy one week in advance and they will send [place] a request.  Thank you for letting us take part in your care. Wishing you the best!  Thank you, Idamae Schuller, MD

## 2023-02-01 NOTE — Progress Notes (Signed)
Subjective:   Kayla Williams is a 72 y.o. female who presents for an Initial Medicare Annual Wellness Visit. I connected with  Kayla Williams on 02/01/23 by a  Face-To-Face  enabled telemedicine application and verified that I am speaking with the correct person using two identifiers.  Patient Location: Other:  Office/Clinic  Provider Location: Office/Clinic  I discussed the limitations of evaluation and management by telemedicine. The patient expressed understanding and agreed to proceed.  Review of Systems    Defer to PCP       Objective:    Today's Vitals   02/01/23 1424  BP: 118/89  Pulse: 63  Resp: (!) 28  Temp: 98.1 F (36.7 C)  TempSrc: Oral  SpO2: 99%  Weight: 171 lb 8 oz (77.8 kg)  Height: '5\' 6"'$  (1.676 m)   Body mass index is 27.68 kg/m.     02/01/2023    2:28 PM 02/01/2023   10:48 AM 03/19/2022    1:18 PM 02/09/2022    1:18 PM 04/09/2021   10:10 AM 03/05/2020    9:32 AM 09/07/2019    2:57 PM  Advanced Directives  Does Patient Have a Medical Advance Directive? No No No No No No No  Would patient like information on creating a medical advance directive? No - Patient declined No - Patient declined No - Patient declined No - Patient declined No - Patient declined No - Patient declined No - Patient declined    Current Medications (verified) Outpatient Encounter Medications as of 02/01/2023  Medication Sig   amLODipine (NORVASC) 10 MG tablet Take 1 tablet (10 mg total) by mouth daily.   ezetimibe (ZETIA) 10 MG tablet Take 1 tablet (10 mg total) by mouth daily.   iron polysaccharides (FERREX 150) 150 MG capsule Take 1 capsule (150 mg total) by mouth 2 (two) times daily.   labetalol (NORMODYNE) 100 MG tablet Take 1 tablet by mouth twice daily   losartan (COZAAR) 25 MG tablet Take 1 tablet (25 mg total) by mouth daily.   No facility-administered encounter medications on file as of 02/01/2023.    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 No pertinent family history. Social History   Socioeconomic History   Marital status: Widowed    Spouse name: Not on file   Number of children: Not on file   Years of education: Not on file   Highest education level: Not on file  Occupational History   Not on file  Tobacco Use   Smoking status: Never   Smokeless tobacco: Never  Vaping Use   Vaping Use: Never used  Substance and Sexual Activity   Alcohol use: Not Currently   Drug use: Never   Sexual activity: Yes  Other Topics Concern   Not on file  Social History Narrative   Not on file   Social Determinants of Health   Financial Resource Strain: Low Risk  (02/01/2023)   Overall Financial Resource Strain (CARDIA)    Difficulty of Paying Living Expenses: Not hard at all  Food Insecurity: No Food Insecurity (02/01/2023)  Hunger Vital Sign    Worried About Running Out of Food in the Last Year: Never true    Ran Out of Food in the Last Year: Never true  Transportation Needs: No Transportation Needs (02/01/2023)   PRAPARE - Hydrologist (Medical): No    Lack of Transportation (Non-Medical): No  Physical Activity: Sufficiently Active (02/01/2023)   Exercise Vital Sign    Days of Exercise per Week: 5 days    Minutes of Exercise per Session: 60 min  Stress: No Stress Concern Present (02/01/2023)   [place]    Feeling of Stress : Not at all  Social Connections: Socially Isolated (02/01/2023)   Social Connection and Isolation Panel [NHANES]    Frequency of Communication with Friends and Family: More than  three times a week    Frequency of Social Gatherings with Friends and Family: More than three times a week    Attends Religious Services: Never    Marine scientist or Organizations: No    Attends Music therapist: Never    Marital Status: Separated    Tobacco Counseling Counseling given: Not Answered   Clinical Intake:  Pre-visit preparation completed: Yes  Pain : No/denies pain     Nutritional Risks: None Diabetes: No  How often do you need to have someone help you when you read instructions, pamphlets, or other written materials from your doctor or pharmacy?: 1 - Never What is the last grade level you completed in school?: 12th grade  Diabetic?No  Interpreter Needed?: No  Information entered by :: Siria Calandro,cma   Activities of Daily Living    02/01/2023    2:28 PM 02/01/2023   10:47 AM  In your present state of health, do you have any difficulty performing the following activities:  Hearing? 0 0  Vision? 1 1  Comment  needs new glasses  Difficulty concentrating or making decisions? 0 0  Walking or climbing stairs? 0 0  Dressing or bathing? 0 0  Doing errands, shopping? 0 0    Patient Care Team: Masters, Joellen Jersey, DO as PCP - General  Indicate any recent Medical Services you may have received from other than [facility] providers in the past year (date may be approximate).     Assessment:   This is a routine wellness examination for [name].  Hearing/Vision screen No results found.  Dietary issues and exercise activities discussed:     Goals Addressed   None   Depression Screen    02/01/2023    2:28 PM 03/19/2022    1:20 PM 02/09/2022    1:18 PM 04/09/2021   11:41 AM 03/05/2020    9:34 AM 10/31/2018    1:37 PM 10/04/2018   11:08 AM  PHQ 2/9 Scores  PHQ - 2 Score 0 0 0 0 0 0 0  PHQ- 9 Score    0 0 0     Fall Risk    02/01/2023    2:28 PM 02/01/2023   10:47 AM 03/19/2022    1:17 PM 02/09/2022    1:16 PM 04/09/2021   10:10 AM  Fall Risk    Falls in the past year? 0 0 1 1 0  Number falls in past yr: 0 0 0 1   Injury with Fall? 0 0 0 0   Risk for fall due to : History of fall(s);Impaired balance/gait;No Fall Risks History of fall(s);No Fall Risks;Impaired balance/gait Impaired balance/gait  Impaired balance/gait;History of fall(s) No Fall Risks  Follow up Falls evaluation completed;Falls prevention discussed Falls evaluation completed;Falls prevention discussed Falls evaluation completed Falls evaluation completed Falls prevention discussed    FALL RISK PREVENTION PERTAINING TO THE HOME:  Any stairs in or around the home? Yes  If so, are there any without handrails? Yes  Home free of loose throw rugs in walkways, pet beds, electrical cords, etc? Yes  Adequate lighting in your home to reduce risk of falls? Yes   ASSISTIVE DEVICES UTILIZED TO PREVENT FALLS:  Life alert? No  Use of a cane, walker or w/c? No  Grab bars in the bathroom? No  Shower chair or bench in shower? Yes  Elevated toilet seat or a handicapped toilet? No   TIMED UP AND GO:  Was the test performed? Yes .  Length of time to ambulate 10 feet: 1 min.   Gait slow and steady without use of assistive device  Cognitive Function:        02/01/2023    2:28 PM  6CIT Screen  What Year? 0 points  What month? 0 points  What time? 0 points  Count back from 20 0 points  Months in reverse 0 points  Repeat phrase 0 points  Total Score 0 points    Immunizations Immunization History  Administered Date(s) Administered   Influenza,inj,Quad PF,6+ Mos 07/25/2018   PFIZER(Purple Top)SARS-COV-2 Vaccination 01/28/2020, 02/19/2020   Pneumococcal Conjugate-13 06/27/2018   Pneumococcal Polysaccharide-23 04/09/2021   Tdap 06/27/2018    TDAP status: Up to date  Flu Vaccine status: Due, Education has been provided regarding the importance of this vaccine. Advised may receive this vaccine at local pharmacy or Health Dept. Aware to provide a copy of the vaccination  record if obtained from local pharmacy or Health Dept. Verbalized acceptance and understanding.  Pneumococcal vaccine status: Up to date  Covid-19 vaccine status: Completed vaccines  Qualifies for Shingles Vaccine? No   Zostavax completed No   Shingrix Completed?: No.    Education has been provided regarding the importance of this vaccine. Patient has been advised to call insurance company to determine out of pocket expense if they have not yet received this vaccine. Advised may also receive vaccine at local pharmacy or Health Dept. Verbalized acceptance and understanding.  Screening Tests Health Maintenance  Topic Date Due   Zoster Vaccines- Shingrix (1 of 2) Never done   COVID-19 Vaccine (3 - Pfizer risk series) 03/18/2020   INFLUENZA VACCINE  06/30/2022   Medicare Annual Wellness (AWV)  02/01/2024   MAMMOGRAM  03/18/2024   COLONOSCOPY (Pts 45-43yr Insurance coverage will need to be confirmed)  05/17/2028   DTaP/Tdap/Td (2 - Td or Tdap) 06/27/2028   Pneumonia Vaccine 72 Years old  Completed   DEXA SCAN  Completed   Hepatitis C Screening  Completed   HPV VACCINES  Aged Out    Health Maintenance  Health Maintenance Due  Topic Date Due   Zoster Vaccines- Shingrix (1 of 2) Never done   COVID-19 Vaccine (3 - Pfizer risk series) 03/18/2020   INFLUENZA VACCINE  06/30/2022    Colorectal cancer screening: Type of screening: Colonoscopy. Completed 05/17/2018. Repeat every 10 years  Mammogram status: Completed 03/15/2022. Repeat every year:  Bone Density status: Completed 09/01/2022. Results reflect: Bone density results: OSTEOPENIA. Repeat every 0 years.  Lung Cancer Screening: (Low Dose CT Chest recommended if Age 72-80years, 30 pack-year currently smoking OR have quit w/in 15years.) does not qualify.   Lung Cancer  Screening Referral: N/A  Additional Screening:  Hepatitis C Screening: does not qualify; Completed 06/27/2018  Vision Screening: Recommended annual  ophthalmology exams for early detection of glaucoma and other disorders of the eye. Is the patient up to date with their annual eye exam?  Yes  Who is the provider or what is the name of the office in which the patient attends annual eye exams? [facility] [place] If pt is not established with a provider, would they like to be referred to a provider to establish care? No .   Dental Screening: Recommended annual dental exams for proper oral hygiene  Community Resource Referral / Chronic Care Management: CRR required this visit?  No   CCM required this visit?  No      Plan:     I have personally reviewed and noted the following in the patient's chart:   Medical and social history Use of alcohol, tobacco or illicit drugs  Current medications and supplements including opioid prescriptions. Patient is not currently taking opioid prescriptions. Functional ability and status Nutritional status Physical activity Advanced directives List of other physicians Hospitalizations, surgeries, and ER visits in previous 12 months Vitals Screenings to include cognitive, depression, and falls Referrals and appointments  In addition, I have reviewed and discussed with patient certain preventive protocols, quality metrics, and best practice recommendations. A written personalized care plan for preventive services as well as general preventive health recommendations were provided to patient.     Kerin Perna, [facility]   02/01/2023   Nurse Notes: Face-To-Face Visit  Ms. Lundsten , Thank you for taking time to come for your Medicare Wellness Visit. I appreciate your ongoing commitment to your health goals. Please review the following plan we discussed and let me know if I can assist you in the future.   These are the goals we discussed:  Goals   None     This is a list of the screening recommended for you and due dates:  Health Maintenance  Topic Date Due   Zoster (Shingles) Vaccine (1 of 2) Never  done   COVID-19 Vaccine (3 - Pfizer risk series) 03/18/2020   Flu Shot  06/30/2022   Medicare Annual Wellness Visit  02/01/2024   Mammogram  03/18/2024   Colon Cancer Screening  05/17/2028   DTaP/Tdap/Td vaccine (2 - Td or Tdap) 06/27/2028   Pneumonia Vaccine  Completed   DEXA scan (bone density measurement)  Completed   Hepatitis C Screening: USPSTF Recommendation to screen - Ages 86-79 yo.  Completed   HPV Vaccine  Aged Out

## 2023-02-02 LAB — LIPID PANEL

## 2023-02-03 ENCOUNTER — Other Ambulatory Visit: Payer: Self-pay | Admitting: Internal Medicine

## 2023-02-03 ENCOUNTER — Encounter: Payer: Self-pay | Admitting: Internal Medicine

## 2023-02-03 DIAGNOSIS — S98911A Complete traumatic amputation of right foot, level unspecified, initial encounter: Secondary | ICD-10-CM

## 2023-02-03 DIAGNOSIS — E785 Hyperlipidemia, unspecified: Secondary | ICD-10-CM

## 2023-02-03 DIAGNOSIS — I10 Essential (primary) hypertension: Secondary | ICD-10-CM

## 2023-02-03 HISTORY — DX: Complete traumatic amputation of right foot, level unspecified, initial encounter: S98.911A

## 2023-02-03 LAB — BMP8+ANION GAP
Anion Gap: 18 mmol/L (ref 10.0–18.0)
BUN/Creatinine Ratio: 18 (ref 12–28)
BUN: 20 mg/dL (ref 8–27)
CO2: 21 mmol/L (ref 20–29)
Calcium: 9.9 mg/dL (ref 8.7–10.3)
Chloride: 102 mmol/L (ref 96–106)
Creatinine, Ser: 1.11 mg/dL — ABNORMAL HIGH (ref 0.57–1.00)
Glucose: 83 mg/dL (ref 70–99)
Potassium: 3.9 mmol/L (ref 3.5–5.2)
Sodium: 141 mmol/L (ref 134–144)
eGFR: 53 mL/min/{1.73_m2} — ABNORMAL LOW (ref >59)

## 2023-02-03 LAB — LIPID PANEL
Chol/HDL Ratio: 3.7 ratio (ref 0.0–4.4)
Cholesterol, Total: 227 mg/dL — ABNORMAL HIGH (ref 100–199)
HDL: 62 mg/dL (ref >39)
LDL Chol Calc (NIH): 151 mg/dL — ABNORMAL HIGH (ref 0–99)
Triglycerides: 78 mg/dL (ref 0–149)
VLDL Cholesterol Cal: 14 mg/dL (ref 5–40)

## 2023-02-03 LAB — CBC
Hematocrit: 37.7 % (ref 34.0–46.6)
Hemoglobin: 12.7 g/dL (ref 11.1–15.9)
MCH: 28.1 pg (ref 26.6–33.0)
MCHC: 33.7 g/dL (ref 31.5–35.7)
MCV: 83 fL (ref 79–97)
Platelets: 324 10*3/uL (ref 150–450)
RBC: 4.52 x10E6/uL (ref 3.77–5.28)
RDW: 14.6 % (ref 11.7–15.4)
WBC: 4.5 10*3/uL (ref 3.4–10.8)

## 2023-02-03 LAB — HEMOGLOBIN A1C
Est. average glucose Bld gHb Est-mCnc: 111 mg/dL
Hgb A1c MFr Bld: 5.5 % (ref 4.8–5.6)

## 2023-02-03 LAB — VITAMIN D 25 HYDROXY (VIT D DEFICIENCY, FRACTURES): Vit D, 25-Hydroxy: 30 ng/mL (ref 30.0–100.0)

## 2023-02-03 MED ORDER — ROSUVASTATIN CALCIUM 20 MG PO TABS: 20.0000 mg | ORAL_TABLET | Freq: Every day | ORAL | 3 refills | Status: DC

## 2023-02-03 MED ORDER — LOSARTAN POTASSIUM 50 MG PO TABS: 50.0000 mg | ORAL_TABLET | Freq: Every day | ORAL | 3 refills | Status: DC

## 2023-02-03 NOTE — Addendum Note (Signed)
Addended by: Idamae Schuller on: 02/03/2023 08:35 AM   Modules accepted: Orders

## 2023-02-03 NOTE — Assessment & Plan Note (Signed)
Pt with IDA and was taken iron due to IDA. CBC rechecked and hgb normal. .

## 2023-02-03 NOTE — Assessment & Plan Note (Signed)
Hx of prediabetes but A1c normal. Will advise continuing good life style changes.

## 2023-02-03 NOTE — Assessment & Plan Note (Addendum)
Pt has HTN and is on Amlodipine 5 mg qd, labetalol 100 mg BID, Losartan 25 mg qd. Last renal fxn panel with elevated Cr. Repeat BMP this visit elevated Cr but improved from prior. Pt bradycardic on exam so will stop labetalol and increase losartan to 50 mg daily. If uncontrolled on this can increase losartan to 100 mg qd. Increasing ARB provides more reno-protective effectives.

## 2023-02-03 NOTE — Assessment & Plan Note (Signed)
Lipid panel with LDL on 168. On Zetia 10 mg. Repeat lipid panel shows LDL at 151 with ASCVD risk of 12.4 %. Will stop Zetia and start Crestor 20 mg qd.

## 2023-02-03 NOTE — Assessment & Plan Note (Signed)
Referral to GI placed for colonoscopy.

## 2023-02-03 NOTE — Assessment & Plan Note (Signed)
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 Pt requesting podiatry referral for her foot given hx of amputation. States last prosthesis was made about 10 years ago. Podiatry referral placed.

## 2023-02-04 ENCOUNTER — Telehealth: Payer: Self-pay

## 2023-02-04 NOTE — Telephone Encounter (Signed)
Pt is requesting a call back ... She stated that @ her visit with Dr Humphrey Rolls 3/4 she is still waiting to know if she should talk her  iron meds

## 2023-02-04 NOTE — Addendum Note (Signed)
Addended by: Idamae Schuller on: 02/04/2023 01:14 PM   Modules accepted: Orders

## 2023-02-10 NOTE — Progress Notes (Signed)
Internal Medicine Clinic Attending  Case discussed with Dr. Khan  at the time of the visit.  We reviewed the resident's history and exam and pertinent patient test results.  I agree with the assessment, diagnosis, and plan of care documented in the resident's note.  

## 2023-02-15 NOTE — Progress Notes (Signed)
Internal Medicine Clinic Attending  Case and documentation reviewed.  I reviewed the AWV findings.  I agree with the assessment, diagnosis, and plan of care documented in the AWV note.    Kerriann Kamphuis, MD    

## 2023-02-16 ENCOUNTER — Encounter: Payer: Medicare Other | Admitting: Student

## 2023-03-04 ENCOUNTER — Ambulatory Visit (INDEPENDENT_AMBULATORY_CARE_PROVIDER_SITE_OTHER): Payer: Medicare Other | Admitting: Student

## 2023-03-04 VITALS — BP 133/60 | HR 60 | Temp 97.9°F | Ht 66.0 in | Wt 173.4 lb

## 2023-03-04 DIAGNOSIS — I10 Essential (primary) hypertension: Secondary | ICD-10-CM | POA: Diagnosis present

## 2023-03-04 NOTE — Assessment & Plan Note (Addendum)
BP 148/67 and improved to 133/60 on repeat. Home measurements between A999333 systolic. Reports compliance with medications. Continue on current amlodipine 10mg  daily and losartan 50 mg daily. Repeat BMP today.

## 2023-03-04 NOTE — Progress Notes (Signed)
Established Patient Office Visit  Subjective   Patient ID: Kayla Williams, female    DOB: 12-12-1950  Age: 72 y.o. MRN: [id number]  Chief Complaint  Patient presents with   Follow-up    Kayla Williams is a 72 y.o. person living with a history listed below who presents to clinic for follow up of hypertension. Please refer to problem based charting for further details and assessment and plan of current problem and chronic medical conditions.     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  Has resolved with omeprazole.  Hgb, mcv now up trending with iron replacement.     ROS: negative as per HPI    Objective:     BP 133/60 (BP Location: Right Arm, Patient Position: Sitting, Cuff Size: Small)   Pulse 60   Temp 97.9 F (36.6 C) (Oral)   Ht 5\' 6"  (1.676 m)   Wt 173 lb 6.4 oz (78.7 kg)   SpO2 100%   BMI 27.99 kg/m  BP Readings from Last 3 Encounters:  03/04/23 133/60  02/01/23 118/89  02/01/23 118/66      Physical Exam Constitutional:      Appearance: Normal appearance.  HENT:     Head: Normocephalic and atraumatic.     Mouth/Throat:     Mouth: Mucous membranes are moist.     Pharynx: Oropharynx is clear.  Eyes:     Extraocular Movements: Extraocular movements intact.     Conjunctiva/sclera: Conjunctivae normal.     Pupils: Pupils are equal, round, and  reactive to light.  Cardiovascular:     Rate and Rhythm: Normal rate and regular rhythm.     Pulses: Normal pulses.     Heart sounds: No murmur heard. Pulmonary:     Effort: Pulmonary effort is normal.     Breath sounds: No rhonchi or rales.  Abdominal:     General: Abdomen is flat. Bowel sounds are normal.     Palpations: Abdomen is soft.     Tenderness: There is no abdominal tenderness.  Musculoskeletal:        General: Normal range of motion.     Right lower leg: No edema.     Left lower leg: No edema.  Skin:    General: Skin is warm and dry.     Capillary Refill: Capillary refill takes less than 2 seconds.  Neurological:     General: No focal deficit present.     Mental Status: She is alert and oriented to person, place, and time.  Psychiatric:        Mood and Affect: Mood normal.        Behavior: Behavior normal.      No results found for any visits on 03/04/23.  Last metabolic panel Lab Results  Component Value Date   GLUCOSE 83 02/01/2023  NA 141 02/01/2023   K 3.9 02/01/2023   CL 102 02/01/2023   CO2 21 02/01/2023   BUN 20 02/01/2023   CREATININE 1.11 (H) 02/01/2023   EGFR 53 (L) 02/01/2023   CALCIUM 9.9 02/01/2023   PROT 7.5 03/09/2018   ALBUMIN 4.1 03/09/2018   BILITOT 0.5 03/09/2018   ALKPHOS 45 03/09/2018   AST 21 03/09/2018   ALT 16 03/09/2018   ANIONGAP 10 03/10/2018      The 10-year ASCVD risk score (Arnett DK, et al., 2019) is: 15.3%    Assessment & Plan:   Problem List Items Addressed This Visit       Cardiovascular and Mediastinum   Hypertension - Primary    BP 148/67 and improved to 133/60 on repeat. Home measurements between A999333 systolic. Reports compliance with medications. Continue on current amlodipine 10mg  daily and losartan 50 mg daily. Repeat BMP today.       Relevant Orders   BMP8+Anion Gap    Return in about 6 months (around 09/03/2023).    Iona Beard, MD

## 2023-03-04 NOTE — Patient Instructions (Signed)
It was a pleasure seeing you in clinic today.   You blood pressure is looking good today. We will check you labs since we increased your BP medications last visit. I will call with the results.   Follow up in 6 months

## 2023-03-05 LAB — BMP8+ANION GAP
Anion Gap: 16 mmol/L (ref 10.0–18.0)
BUN/Creatinine Ratio: 15 (ref 12–28)
BUN: 14 mg/dL (ref 8–27)
CO2: 23 mmol/L (ref 20–29)
Calcium: 9.8 mg/dL (ref 8.7–10.3)
Chloride: 104 mmol/L (ref 96–106)
Creatinine, Ser: 0.96 mg/dL (ref 0.57–1.00)
Glucose: 90 mg/dL (ref 70–99)
Potassium: 3.6 mmol/L (ref 3.5–5.2)
Sodium: 143 mmol/L (ref 134–144)
eGFR: 63 mL/min/{1.73_m2} (ref >59)

## 2023-03-08 NOTE — Addendum Note (Signed)
Addended by: Debe Coder B on: 03/08/2023 02:14 PM   Modules accepted: Level of Service

## 2023-03-08 NOTE — Progress Notes (Signed)
Internal Medicine Clinic Attending  Case discussed with Dr. Liang  at the time of the visit.  We reviewed the resident's history and exam and pertinent patient test results.  I agree with the assessment, diagnosis, and plan of care documented in the resident's note.  

## 2023-04-02 ENCOUNTER — Ambulatory Visit (HOSPITAL_COMMUNITY)
Admission: EM | Admit: 2023-04-02 | Discharge: 2023-04-02 | Disposition: A | Discharge status: Home / Self Care | Payer: Medicare Other | Attending: Physician Assistant | Admitting: Physician Assistant

## 2023-04-02 ENCOUNTER — Encounter (HOSPITAL_COMMUNITY): Payer: Self-pay

## 2023-04-02 DIAGNOSIS — K047 Periapical abscess without sinus: Secondary | ICD-10-CM

## 2023-04-02 MED ORDER — AMOXICILLIN 500 MG PO CAPS: 500.0000 mg | ORAL_CAPSULE | Freq: Three times a day (TID) | ORAL | 0 refills | Status: AC

## 2023-04-02 NOTE — ED Provider Notes (Signed)
[facility]    CSN: [id number] Arrival date & time: 04/02/23  0806      History   Chief Complaint Chief Complaint  Patient presents with   Dental Problem    HPI Kayla Williams is a 72 y.o. female.   Patient here c/w dental pain x 3 days. She noted swelling started last night.  Pain located R side mandible.  She is taking ibuprofen w/o relief.  None today.  She denies f/c, difficulty swallowing, n/v.  She does not see a dentist regularly.    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  Has resolved with omeprazole.  Hgb, mcv now up trending with iron replacement.      Patient Active Problem List   Diagnosis Date Noted   Pre-diabetes 02/10/2022   CKD (chronic kidney disease) stage 3, GFR 30-59 ml/min (HCC) 04/15/2021   Healthcare maintenance 06/28/2018   Hyperlipidemia 06/28/2018   Seasonal allergies 03/22/2018   Hypertension 03/10/2018    Past Surgical History:  Procedure Laterality Date   FOOT AMPUTATION Right 1994   "for cancer"    OB History   No obstetric history on file.      Home Medications    Prior to Admission medications   Medication Sig Start Date End Date Taking? Authorizing Provider  amLODipine (NORVASC) 10 MG tablet Take 1 tablet by mouth once daily 02/03/23  Yes Gwenevere Abbot, MD  amoxicillin (AMOXIL) 500 MG capsule Take 1 capsule (500 mg total) by mouth 3 (three) times daily for 10 days. 04/02/23 04/12/23 Yes Evern Core, PA-C  losartan (COZAAR) 50 MG tablet Take 1 tablet (50 mg total) by mouth daily. 02/03/23 02/03/24 Yes Gwenevere Abbot, MD  rosuvastatin (CRESTOR) 20 MG tablet Take 1 tablet (20 mg total) by  mouth daily. 02/03/23 02/03/24 Yes Gwenevere Abbot, MD    Family History History reviewed. No pertinent family history.  Social History Social History   Tobacco Use   Smoking status: Never   Smokeless tobacco: Never  Vaping Use   Vaping Use: Never used  Substance Use Topics   Alcohol use: Not Currently   Drug use: Never     Allergies   Lisinopril   Review of Systems Review of Systems  Constitutional:  Negative for chills, fatigue and fever.  HENT:  Positive for dental problem and facial swelling. Negative for drooling, sore throat and trouble swallowing.   Respiratory:  Negative for cough, shortness of breath and wheezing.   Gastrointestinal:  Negative for nausea and vomiting.  Musculoskeletal:  Positive for myalgias. Negative for arthralgias.  Skin:  Negative for color change.  Hematological:  Negative for adenopathy. Does not bruise/bleed easily.  Psychiatric/Behavioral:  Negative for sleep disturbance.      Physical Exam Triage Vital Signs ED Triage Vitals [04/02/23 0844]  Enc Vitals Group     BP (!) 158/85     Pulse Rate 79     Resp 18     Temp 98.5 F (36.9 C)     Temp Source Oral     SpO2 96 %     Weight  Height      Head Circumference      Peak Flow      Pain Score      Pain Loc      Pain Edu?      Excl. in GC?    No data found.  Updated Vital Signs BP (!) 158/85 (BP Location: Left Arm)   Pulse 79   Temp 98.5 F (36.9 C) (Oral)   Resp 18   SpO2 96%   Visual Acuity Right Eye Distance:   Left Eye Distance:   Bilateral Distance:    Right Eye Near:   Left Eye Near:    Bilateral Near:     Physical Exam Vitals and nursing note reviewed.  Constitutional:      General: She is not in acute distress.    Appearance: Normal appearance. She is not ill-appearing.  HENT:     Head: Normocephalic and atraumatic.     Right Ear: Tympanic membrane and ear canal normal.     Left Ear: Tympanic membrane and ear canal normal.     Nose: No congestion or  rhinorrhea.     Mouth/Throat:     Dentition: Abnormal dentition. Does not have dentures. Dental tenderness, gingival swelling, dental caries, dental abscesses and gum lesions present.     Tongue: No lesions.     Pharynx: Uvula midline. No oropharyngeal exudate, posterior oropharyngeal erythema or uvula swelling.     Tonsils: No tonsillar exudate or tonsillar abscesses.     Comments: Multiple missing and fractured teeth throughout mouth Gingival swelling buccal aspect R mandible area around 2nd molar.   Eyes:     General: No scleral icterus.    Extraocular Movements: Extraocular movements intact.     Conjunctiva/sclera: Conjunctivae normal.  Pulmonary:     Effort: Pulmonary effort is normal. No respiratory distress.  Musculoskeletal:     Cervical back: Normal range of motion. No rigidity.  Lymphadenopathy:     Cervical: No cervical adenopathy.  Skin:    Coloration: Skin is not jaundiced.     Findings: No rash.  Neurological:     General: No focal deficit present.     Mental Status: She is alert and oriented to person, place, and time.     Motor: No weakness.     Gait: Gait normal.  Psychiatric:        Mood and Affect: Mood normal.        Behavior: Behavior normal.      UC Treatments / Results  Labs (all labs ordered are listed, but only abnormal results are displayed) Labs Reviewed - No data to display  EKG   Radiology No results found.  Procedures Procedures (including critical care time)  Medications Ordered in UC Medications - No data to display  Initial Impression / Assessment and Plan / UC Course  I have reviewed the triage vital signs and the nursing notes.  Pertinent labs & imaging results that were available during my care of the patient were reviewed by me and considered in my medical decision making (see chart for details).     Follow up with dentist as soon as possible Take medication as prescribed Return if you have new or worsening  symptoms Final Clinical Impressions(s) / UC Diagnoses   Final diagnoses:  Dental infection     Discharge Instructions      Follow up with dentist as soon as possible    ED Prescriptions     Medication Sig Dispense Auth. Provider  amoxicillin (AMOXIL) 500 MG capsule Take 1 capsule (500 mg total) by mouth 3 (three) times daily for 10 days. 30 capsule Evern Core, PA-C      PDMP not reviewed this encounter.   Evern Core, PA-C 04/02/23 [phone]

## 2023-04-02 NOTE — ED Triage Notes (Signed)
Here for dental pain and swelling x 1 week. Pt took Motrin.

## 2023-04-02 NOTE — Discharge Instructions (Addendum)
Follow up with dentist as soon as possible.

## 2023-05-01 ENCOUNTER — Other Ambulatory Visit: Payer: Self-pay | Admitting: Internal Medicine

## 2023-05-01 DIAGNOSIS — I10 Essential (primary) hypertension: Secondary | ICD-10-CM

## 2023-09-12 IMAGING — MG MM DIGITAL SCREENING BILAT W/ TOMO AND CAD
8 series · 8 of 24 positions shown · non-contrast
Comparison: Previous exam(s).

CLINICAL DATA: Screening.

EXAM:
DIGITAL SCREENING BILATERAL MAMMOGRAM WITH TOMOSYNTHESIS AND CAD
TECHNIQUE: Bilateral screening digital craniocaudal and mediolateral oblique
mammograms were obtained. Bilateral screening digital breast
tomosynthesis was performed. The images were evaluated with
computer-aided detection.

[L CC synth-2D]
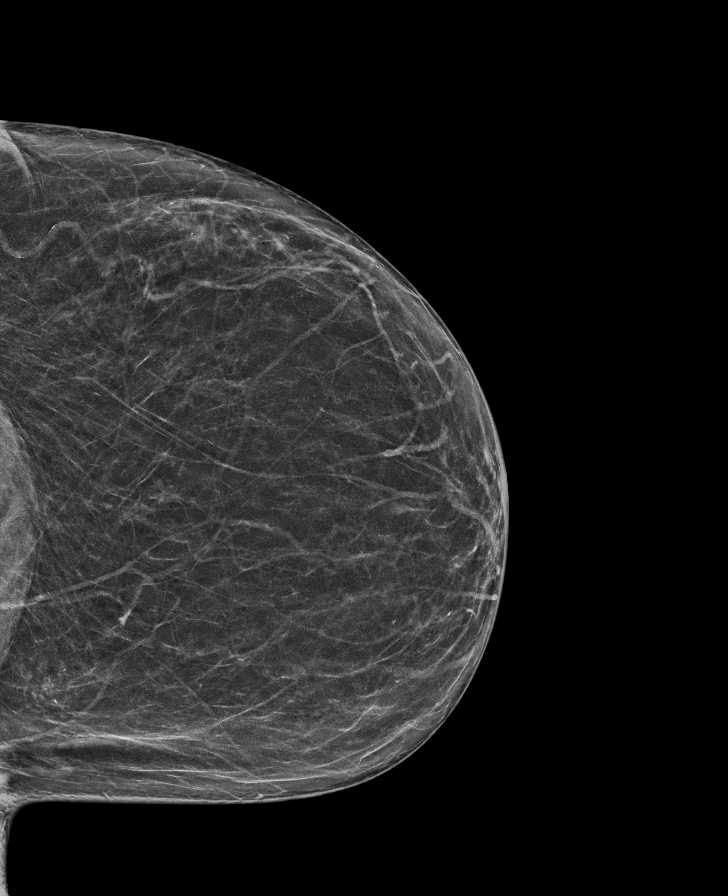

[R MLO synth-2D]
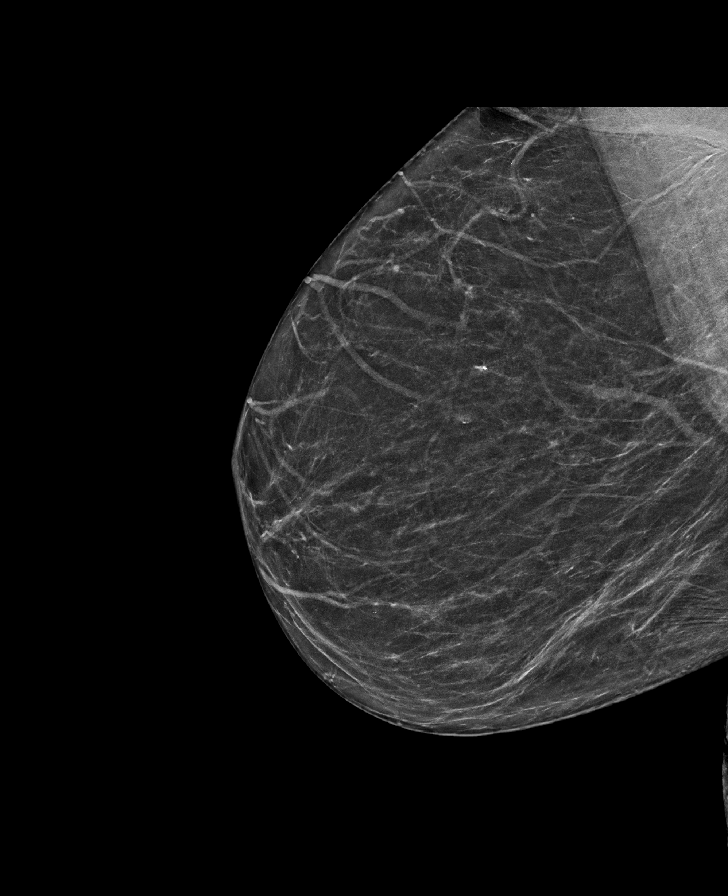

[L MLO synth-2D]
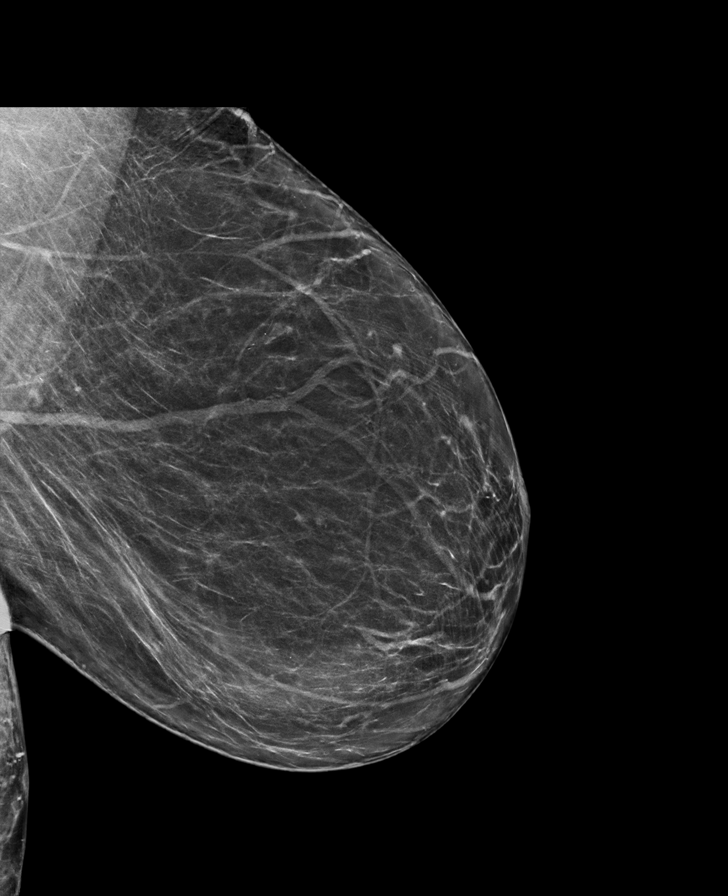

[R CC synth-2D]
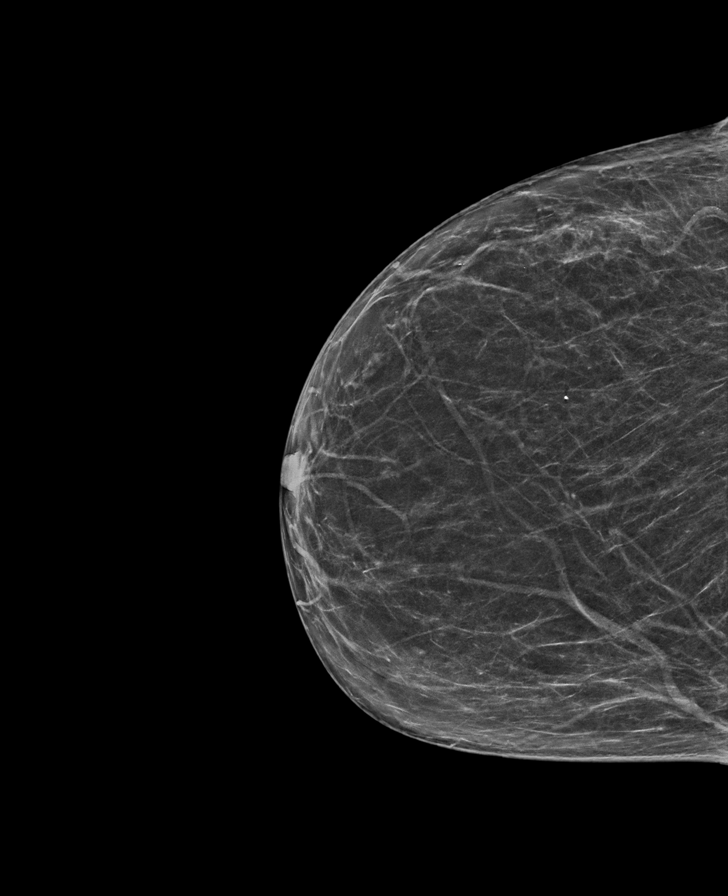

[R MLO tomo · tomo slice 35/69.0]
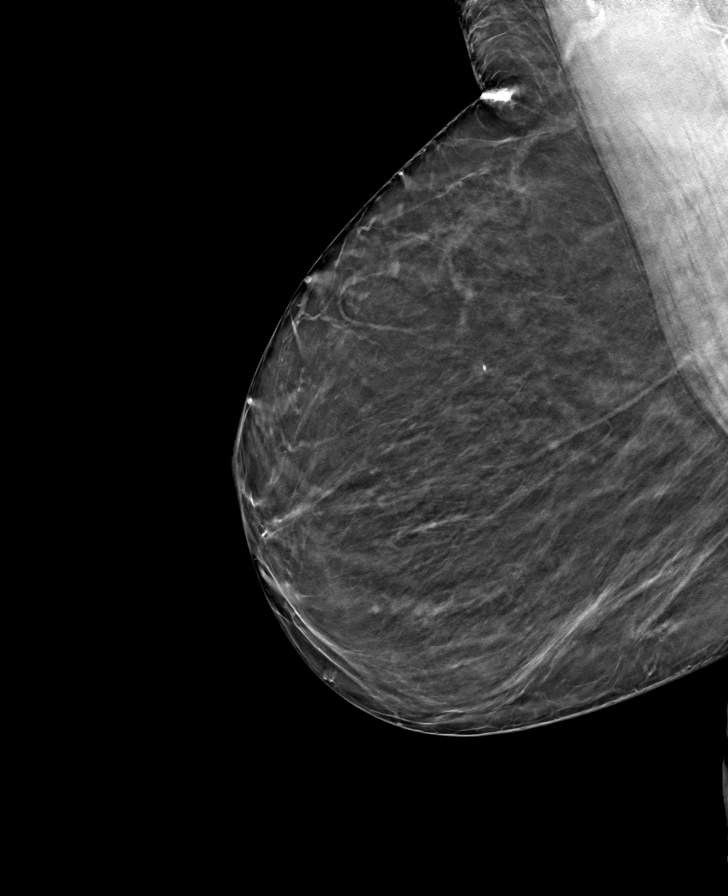

[R CC tomo · tomo slice 35/69.0]
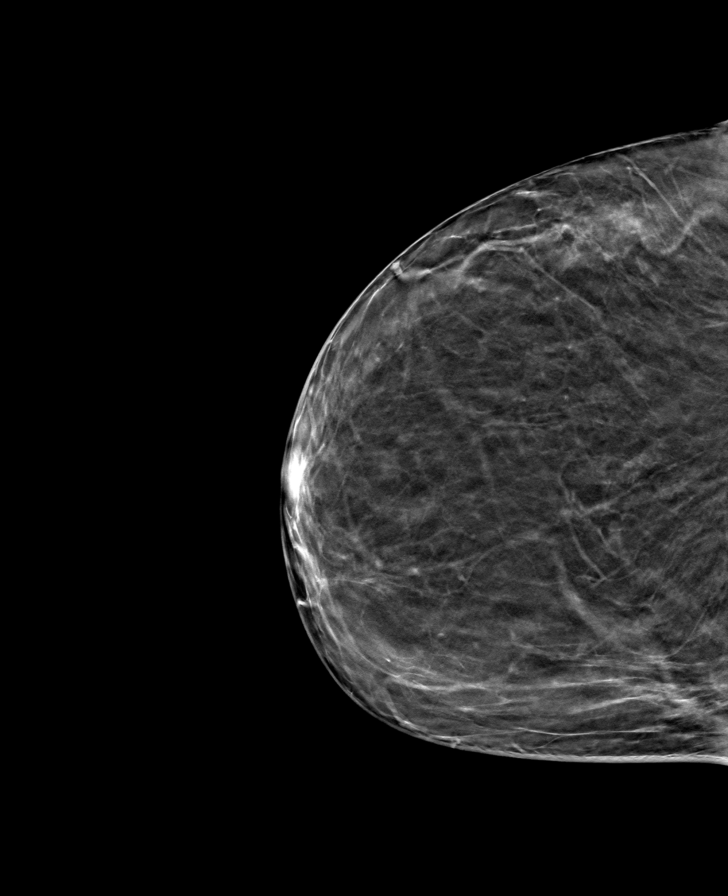

[L MLO tomo · tomo slice 39/78.0]
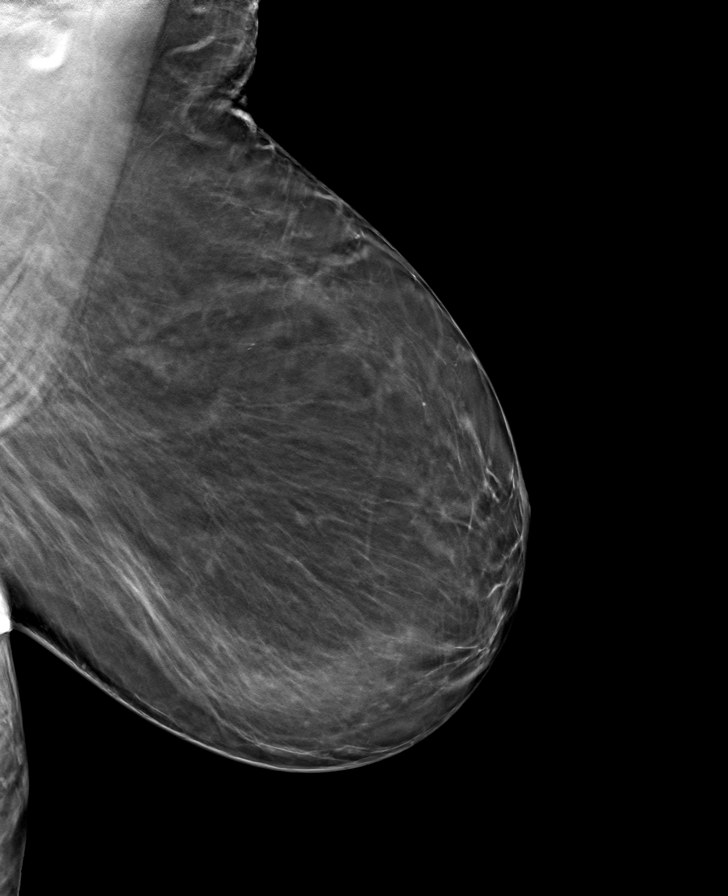

[L CC tomo · tomo slice 34/67.0]
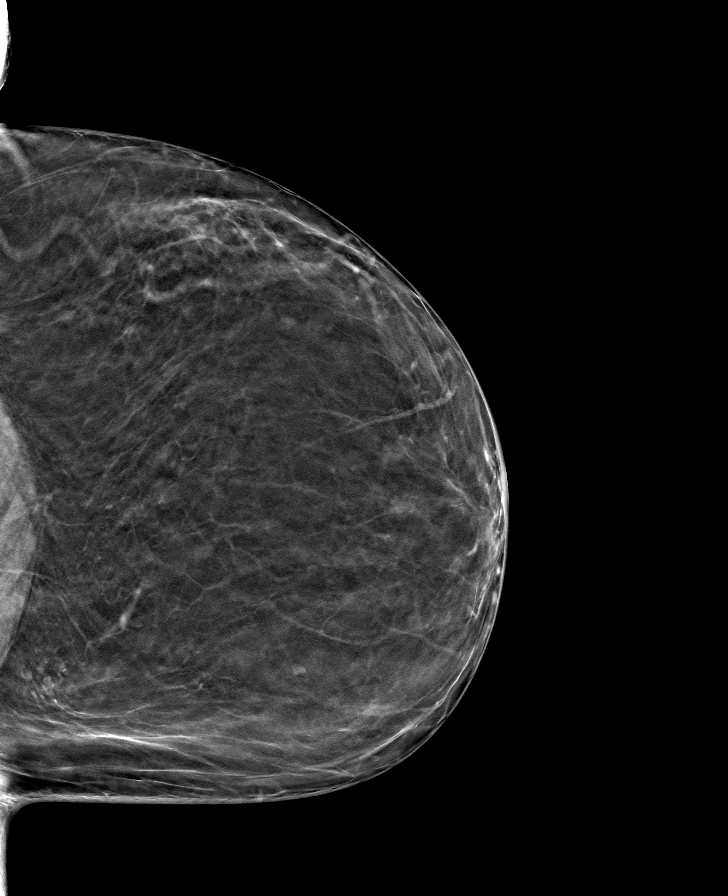

[8 of 24 positions shown; findings below may reference images not displayed]

ACR Breast Density Category b: There are scattered areas of
fibroglandular density.
FINDINGS: There are no findings suspicious for malignancy.
IMPRESSION: No mammographic evidence of malignancy. A result letter of this
screening mammogram will be mailed directly to the patient.

RECOMMENDATION:
Screening mammogram in one year. (Code:51-O-LD2)

BI-RADS CATEGORY  1: Negative.

## 2023-12-27 ENCOUNTER — Ambulatory Visit: Payer: Medicare Other | Admitting: Student

## 2023-12-27 VITALS — BP 144/72 | HR 66 | Temp 97.7°F | Ht 66.0 in | Wt 180.0 lb

## 2023-12-27 DIAGNOSIS — Z0000 Encounter for general adult medical examination without abnormal findings: Secondary | ICD-10-CM

## 2023-12-27 DIAGNOSIS — I10 Essential (primary) hypertension: Secondary | ICD-10-CM | POA: Diagnosis present

## 2023-12-27 DIAGNOSIS — E785 Hyperlipidemia, unspecified: Secondary | ICD-10-CM

## 2023-12-27 DIAGNOSIS — Z1211 Encounter for screening for malignant neoplasm of colon: Secondary | ICD-10-CM

## 2023-12-27 DIAGNOSIS — Z1231 Encounter for screening mammogram for malignant neoplasm of breast: Secondary | ICD-10-CM

## 2023-12-27 DIAGNOSIS — E7800 Pure hypercholesterolemia, unspecified: Secondary | ICD-10-CM

## 2023-12-27 MED ORDER — ROSUVASTATIN CALCIUM 20 MG PO TABS
20.0000 mg | ORAL_TABLET | Freq: Every day | ORAL | 3 refills | Status: AC
Start: 2023-12-27 — End: 2024-12-26

## 2023-12-27 MED ORDER — LOSARTAN POTASSIUM 50 MG PO TABS
50.0000 mg | ORAL_TABLET | Freq: Every day | ORAL | 3 refills | Status: AC
Start: 2023-12-27 — End: 2024-12-26

## 2023-12-27 MED ORDER — AMLODIPINE BESYLATE 10 MG PO TABS: 10.0000 mg | ORAL_TABLET | Freq: Every day | ORAL | 2 refills | Status: DC

## 2023-12-27 NOTE — Patient Instructions (Addendum)
Thank you, [name] for allowing [place] to provide your care today. Today we discussed your blood pressure and healthcare maintenance.   I have ordered the following labs for you:  Lab Orders         Fecal occult blood, imunochemical      Tests ordered today:  None  Referrals ordered today:   Referral Orders  No referral(s) requested today     I have ordered the following medication/changed the following medications:   Stop the following medications: Medications Discontinued During This Encounter  Medication Reason   rosuvastatin (CRESTOR) 20 MG tablet Reorder   losartan (COZAAR) 50 MG tablet Reorder   amLODipine (NORVASC) 10 MG tablet Reorder     Start the following medications: Meds ordered this encounter  Medications   rosuvastatin (CRESTOR) 20 MG tablet    Sig: Take 1 tablet (20 mg total) by mouth daily.    Dispense:  90 tablet    Refill:  3   amLODipine (NORVASC) 10 MG tablet    Sig: Take 1 tablet (10 mg total) by mouth daily.    Dispense:  90 tablet    Refill:  2   losartan (COZAAR) 50 MG tablet    Sig: Take 1 tablet (50 mg total) by mouth daily.    Dispense:  90 tablet    Refill:  3     Follow up: 3 months   Remember:   - Someone will call you to help schedule your mammogram  - Please return your FIT kit to help complete your colon cancer screening, I will follow up on your results as needed  - Please keep a blood pressure log for the next month, if blood pressures are consistently elevated (140s/80s or higher) please call us to move up your appointment so we can decide if we need to adjust your blood pressure medications  - We recommend increased activity (30 minutes of activity x 5 times a week, such as the walking program) and reduced salt intake   - We will see you in 3 months, please continue to take your medications for blood pressure and cholesterol control consistently and let us know if you need any refills   Should you have any  questions or concerns please call the internal medicine clinic at [phone].     Tamberly Pomplun Colbert Coyer, MD PGY-1 Internal Medicine Teaching Progam [facility]

## 2023-12-27 NOTE — Progress Notes (Signed)
Established Patient Office Visit  Subjective   Patient ID: Kayla Williams, female    DOB: 01-12-1951  Age: 73 y.o. MRN: [id number]  Chief Complaint  Patient presents with   Medication Refill   Hypertension    Patient is a 73 y.o. with a past medical history stated below who presents today for follow-up for chronic medical conditions and healthcare maintenance. She was last seen at [facility] on 03/04/2023 for HTN. Please see problem based assessment and plan for additional details.    Medication Refill Pertinent negatives include no abdominal pain, chest pain, headaches, nausea or vomiting.  Hypertension Pertinent negatives include no chest pain, headaches or palpitations.   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  Has resolved with omeprazole.  Hgb, mcv now up trending with iron replacement.     Review of Systems  Cardiovascular:  Negative for chest pain, palpitations and leg swelling.  Gastrointestinal:  Negative for abdominal pain, nausea and vomiting.  Neurological:  Negative for dizziness and headaches.     Objective:     BP (!) 144/72 (BP Location: Left Arm, Patient Position: Sitting, Cuff Size: Small)   Pulse 66   Temp 97.7 F (36.5 C) (Oral)   Ht 5\' 6"  (1.676 m)   Wt 180 lb (81.6 kg)   SpO2 100%   BMI 29.05 kg/m  BP Readings from Last 3 Encounters:  12/27/23 (!) 144/72  04/02/23 (!) 158/85  03/04/23 133/60   Wt Readings from Last 3 Encounters:  12/27/23 180 lb (81.6 kg)  03/04/23 173 lb 6.4 oz (78.7 kg)  02/01/23 171 lb 8 oz (77.8 kg)      Physical Exam Constitutional:      Appearance: Normal appearance.  HENT:      Head: Normocephalic and atraumatic.     Nose: Nose normal.     Mouth/Throat:     Mouth: Mucous membranes are moist.  Eyes:     Extraocular Movements: Extraocular movements intact.     Pupils: Pupils are equal, round, and reactive to light.  Cardiovascular:     Rate and Rhythm: Normal rate and regular rhythm.     Heart sounds: Normal heart sounds.  Pulmonary:     Effort: Pulmonary effort is normal.  Abdominal:     General: Bowel sounds are normal. There is no distension.     Palpations: Abdomen is soft.     Tenderness: There is no abdominal tenderness.  Musculoskeletal:        General: Normal range of motion.  Skin:    General: Skin is warm and dry.  Neurological:     General: No focal deficit present.     Mental Status: She is alert.  Psychiatric:        Mood and Affect: Mood normal.        Behavior: Behavior normal.    No results found for any visits on 12/27/23.  Last metabolic panel Lab Results  Component Value Date   GLUCOSE 90 03/04/2023   NA 143 03/04/2023   K 3.6 03/04/2023   CL 104 03/04/2023   CO2  23 03/04/2023   BUN 14 03/04/2023   CREATININE 0.96 03/04/2023   EGFR 63 03/04/2023   CALCIUM 9.8 03/04/2023   PROT 7.5 03/09/2018   ALBUMIN 4.1 03/09/2018   BILITOT 0.5 03/09/2018   ALKPHOS 45 03/09/2018   AST 21 03/09/2018   ALT 16 03/09/2018   ANIONGAP 10 03/10/2018   The 10-year ASCVD risk score (Arnett DK, et al., 2019) is: 18.3%    Assessment & Plan:   Problem List Items Addressed This Visit     Hypertension   Relevant Medications   rosuvastatin (CRESTOR) 20 MG tablet   amLODipine (NORVASC) 10 MG tablet   losartan (COZAAR) 50 MG tablet   Healthcare maintenance   Hyperlipidemia   Relevant Medications   rosuvastatin (CRESTOR) 20 MG tablet   amLODipine (NORVASC) 10 MG tablet   losartan (COZAAR) 50 MG tablet   Other Visit Diagnoses       Colon cancer screening    -  Primary   Relevant Orders   Fecal occult blood, imunochemical     Breast  cancer screening by mammogram       Relevant Orders   MM 3D SCREENING MAMMOGRAM BILATERAL BREAST      Patient discussed with Dr. Mayford Knife.  Return in about 3 months (around 03/26/2024) for BP check, antihypertensive medication management, health maintenance.   Kama Cammarano Colbert Coyer, MD

## 2023-12-28 NOTE — Assessment & Plan Note (Addendum)
Patient taking amlodipine 10 mg daily and losartan 50 mg daily. BP 149/79 and 144/72 on repeat today. States she takes medications consistently but has had about a 10 lb weight gain over the holidays. Has also been under some stress helping take care of elderly parents. She denies any chest pain, shortness of breath, palpitations, or swelling. States she is committed to increasing level of activity and losing weight. Able to check BP at home. Through joint decision, decided to maintain current regimen and have patient try walking program in addition to checking BP at home. She is to follow up in clinic in 3 months to help assess if she needs additional medications at that time. She will call clinic to schedule an appointment sooner if BP log shows persistently elevated BP. BMP from April 2024 unremarkable. Will repeat BMP at next follow up appointment.  Plan - Continue amlodipine 10 mg daily and losartan 50 mg daily, refills provided - 6 week [facility] walking program handout provided - Patient to keep BP log  - Return to clinic in 3 months or earlier as needed

## 2023-12-28 NOTE — Assessment & Plan Note (Addendum)
Patient taking rosuvastatin/crestor 20 mg daily, tolerating well. LDL 151 11 months ago, will repeat lipid panel at next follow up visit.  Plan - Continue rosuvastatin/crestor 20 mg daily

## 2023-12-28 NOTE — Assessment & Plan Note (Signed)
Patient was supposed to have colonoscopy but canceled it as she had a poor experience with [facility] in the past. Discussed importance of colon cancer screening. Patient is agreeable to FIT test. Mammogram from 2023 normal, due for mammogram.  Plan - FIT test, follow up as needed - Mammogram order placed for breast cancer screening

## 2023-12-31 NOTE — Progress Notes (Signed)
 Internal Medicine Clinic Attending  Case discussed with the resident at the time of the visit.  We reviewed the resident's history and exam and pertinent patient test results.  I agree with the assessment, diagnosis, and plan of care documented in the resident's note.

## 2024-03-06 ENCOUNTER — Other Ambulatory Visit

## 2024-03-06 DIAGNOSIS — Z1211 Encounter for screening for malignant neoplasm of colon: Secondary | ICD-10-CM

## 2024-03-07 ENCOUNTER — Telehealth: Payer: Self-pay | Admitting: *Deleted

## 2024-03-07 NOTE — Telephone Encounter (Signed)
 Mammogram appointment May 14.2025 @ 9:10 am / arrive 9:00 am . Breast center- appointment mailed to the patient she aware of the 75.00 no show fee which is included in mail with appointment.

## 2024-03-08 LAB — FECAL OCCULT BLOOD, IMMUNOCHEMICAL: Fecal Occult Bld: NEGATIVE

## 2024-03-09 ENCOUNTER — Other Ambulatory Visit: Payer: Self-pay

## 2024-03-09 ENCOUNTER — Emergency Department (HOSPITAL_COMMUNITY)
Admission: EM | Admit: 2024-03-09 | Discharge: 2024-03-10 | Disposition: A | Discharge status: Home / Self Care | Attending: Emergency Medicine | Admitting: Emergency Medicine

## 2024-03-09 DIAGNOSIS — E876 Hypokalemia: Secondary | ICD-10-CM

## 2024-03-09 DIAGNOSIS — Z85831 Personal history of malignant neoplasm of soft tissue: Secondary | ICD-10-CM | POA: Diagnosis not present

## 2024-03-09 DIAGNOSIS — I10 Essential (primary) hypertension: Secondary | ICD-10-CM | POA: Diagnosis not present

## 2024-03-09 DIAGNOSIS — Z79899 Other long term (current) drug therapy: Secondary | ICD-10-CM | POA: Diagnosis not present

## 2024-03-09 DIAGNOSIS — R197 Diarrhea, unspecified: Secondary | ICD-10-CM | POA: Diagnosis not present

## 2024-03-09 DIAGNOSIS — R112 Nausea with vomiting, unspecified: Secondary | ICD-10-CM | POA: Insufficient documentation

## 2024-03-09 LAB — CBC
HCT: 37.8 % (ref 36.0–46.0)
Hemoglobin: 13.2 g/dL (ref 12.0–15.0)
MCH: 28 pg (ref 26.0–34.0)
MCHC: 34.9 g/dL (ref 30.0–36.0)
MCV: 80.1 fL (ref 80.0–100.0)
Platelets: 293 10*3/uL (ref 150–400)
RBC: 4.72 MIL/uL (ref 3.87–5.11)
RDW: 13.9 % (ref 11.5–15.5)
WBC: 5.8 10*3/uL (ref 4.0–10.5)
nRBC: 0 % (ref 0.0–0.2)

## 2024-03-09 LAB — COMPREHENSIVE METABOLIC PANEL WITH GFR
ALT: 17 U/L (ref 0–44)
AST: 25 U/L (ref 15–41)
Albumin: 4.3 g/dL (ref 3.5–5.0)
Alkaline Phosphatase: 43 U/L (ref 38–126)
Anion gap: 11 (ref 5–15)
BUN: 13 mg/dL (ref 8–23)
CO2: 23 mmol/L (ref 22–32)
Calcium: 9.7 mg/dL (ref 8.9–10.3)
Chloride: 106 mmol/L (ref 98–111)
Creatinine, Ser: 1.21 mg/dL — ABNORMAL HIGH (ref 0.44–1.00)
GFR, Estimated: 48 mL/min — ABNORMAL LOW (ref >60)
Glucose, Bld: 145 mg/dL — ABNORMAL HIGH (ref 70–99)
Potassium: 2.8 mmol/L — ABNORMAL LOW (ref 3.5–5.1)
Sodium: 140 mmol/L (ref 135–145)
Total Bilirubin: 0.5 mg/dL (ref 0.0–1.2)
Total Protein: 7.5 g/dL (ref 6.5–8.1)

## 2024-03-09 LAB — URINALYSIS, ROUTINE W REFLEX MICROSCOPIC
Bilirubin Urine: NEGATIVE
Glucose, UA: NEGATIVE mg/dL
Hgb urine dipstick: NEGATIVE
Ketones, ur: NEGATIVE mg/dL
Nitrite: NEGATIVE
Protein, ur: NEGATIVE mg/dL
Specific Gravity, Urine: 1.009 (ref 1.005–1.030)
pH: 8 (ref 5.0–8.0)

## 2024-03-09 MED ORDER — POTASSIUM CHLORIDE CRYS ER 20 MEQ PO TBCR
40.0000 meq | EXTENDED_RELEASE_TABLET | Freq: Once | ORAL | Status: AC
  Administered 2024-03-09: 40 meq via ORAL
  Filled 2024-03-09: qty 2

## 2024-03-09 MED ORDER — ONDANSETRON 4 MG PO TBDP
4.0000 mg | ORAL_TABLET | Freq: Once | ORAL | Status: AC
  Administered 2024-03-09: 4 mg via ORAL
  Filled 2024-03-09: qty 1

## 2024-03-09 NOTE — ED Triage Notes (Signed)
 Patient reports multiple emesis and diarrhea with chills this evening , she suspects food poisoning , denies fever .

## 2024-03-09 NOTE — ED Provider Triage Note (Signed)
 Emergency Medicine Provider Triage Evaluation Note  Kayla Williams , a 73 y.o. female  was evaluated in triage.  Pt complains of nausea and vomiting. She reports that she was eating dinner tonight and shortly after began to experience significant nausea and multiple episodes of vomiting. No sick contacts. She is concerned for food poisoning as she ate chicken and about 30 minutes later began to experience nausea and vomiting. No fevers but endorses chills.  Review of Systems  Positive: As above  Negative: As above  Physical Exam  BP (!) 169/84 (BP Location: Left Arm)   Pulse 78   Temp 97.8 F (36.6 C)   Resp 17   SpO2 100%  Gen:   Awake, no distress   Resp:  Normal effort  MSK:   Moves extremities without difficulty  Other:  No abdominal tenderness  Medical Decision Making  Medically screening exam initiated at 8:50 PM.  Appropriate orders placed.  Kayla Williams was informed that the remainder of the evaluation will be completed by another provider, this initial triage assessment does not replace that evaluation, and the importance of remaining in the ED until their evaluation is complete.     Kayla Knudsen, PA-C 03/09/24 2051

## 2024-03-09 NOTE — ED Provider Notes (Signed)
 [place] EMERGENCY DEPARTMENT AT [facility] Provider Note   CSN: [id number] Arrival date & time: 03/09/24  2021     History {Add pertinent medical, surgical, social history, OB history to HPI:1} Chief Complaint  Patient presents with   Emesis   Diarrhea    Kayla Williams is a 73 y.o. female.  The history is provided by the patient.  Emesis Severity:  Mild Timing:  Rare Number of daily episodes:  1 Quality:  Stomach contents Progression:  Resolved Chronicity:  New Context: not post-tussive   Relieved by:  Nothing Worsened by:  Nothing Ineffective treatments:  None tried Associated symptoms: diarrhea   Associated symptoms: no abdominal pain, no cough and no headaches   Diarrhea Quality:  Watery Severity:  Mild Onset quality:  Sudden Number of episodes:  1 Timing:  Rare Progression:  Resolved Associated symptoms: vomiting   Associated symptoms: no abdominal pain and no headaches   Risk factors: no recent antibiotic use and no sick contacts       Past Medical History:  Diagnosis Date   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  Has resolved with omeprazole.  Hgb, mcv now up trending with iron replacement.       Home Medications Prior to Admission medications   Medication Sig Start Date End Date Taking? Authorizing Provider  amLODipine (NORVASC) 10 MG tablet Take 1 tablet (10 mg total) by mouth daily. 12/27/23   Philomena Doheny, MD  losartan (COZAAR) 50 MG tablet Take 1 tablet (50 mg total) by mouth daily. 12/27/23 12/26/24  Philomena Doheny, MD  rosuvastatin (CRESTOR) 20 MG tablet Take 1 tablet (20 mg total) by mouth daily. 12/27/23 12/26/24   Philomena Doheny, MD      Allergies    Lisinopril    Review of Systems   Review of Systems  Respiratory:  Negative for cough.   Cardiovascular:  Negative for chest pain.  Gastrointestinal:  Positive for diarrhea, nausea and vomiting. Negative for abdominal pain.  Neurological:  Negative for headaches.  All other systems reviewed and are negative.   Physical Exam Updated Vital Signs BP (!) 150/81 (BP Location: Right Arm)   Pulse 83   Temp (!) 97.2 F (36.2 C) (Oral)   Resp 16   SpO2 100%  Physical Exam Vitals and nursing note reviewed.  Constitutional:      General: She is not in acute distress.    Appearance: Normal appearance. She is well-developed.  HENT:     Head: Normocephalic and atraumatic.     Nose: Nose normal.  Eyes:     Pupils: Pupils are equal, round, and reactive to light.  Cardiovascular:     Rate and Rhythm: Normal rate and regular rhythm.     Pulses: Normal pulses.     Heart sounds: Normal heart sounds.  Pulmonary:     Effort: Pulmonary effort is normal. No respiratory distress.     Breath sounds: Normal breath sounds.  Abdominal:     General: Bowel sounds are normal. There is no distension.     Palpations: Abdomen is soft.     Tenderness:  There is no abdominal tenderness. There is no guarding or rebound.  Genitourinary:    Vagina: No vaginal discharge.  Musculoskeletal:        General: Normal range of motion.     Cervical back: Normal range of motion and neck supple.  Skin:    General: Skin is warm and dry.     Capillary Refill: Capillary refill takes less than 2 seconds.     Findings: No erythema or rash.  Neurological:     General: No focal deficit present.     Mental Status: She is alert and oriented to person, place, and time.     Deep Tendon Reflexes: Reflexes normal.  Psychiatric:        Mood and Affect: Mood normal.     ED Results / Procedures / Treatments   Labs (all labs ordered are listed, but only abnormal results  are displayed) Results for orders placed or performed during the hospital encounter of 03/09/24  Urinalysis, Routine w reflex microscopic -Urine, Clean Catch   Collection Time: 03/09/24  8:38 PM  Result Value Ref Range   Color, Urine STRAW (A) YELLOW   APPearance HAZY (A) CLEAR   Specific Gravity, Urine 1.009 1.005 - 1.030   pH 8.0 5.0 - 8.0   Glucose, UA NEGATIVE NEGATIVE mg/dL   Hgb urine dipstick NEGATIVE NEGATIVE   Bilirubin Urine NEGATIVE NEGATIVE   Ketones, ur NEGATIVE NEGATIVE mg/dL   Protein, ur NEGATIVE NEGATIVE mg/dL   Nitrite NEGATIVE NEGATIVE   Leukocytes,Ua SMALL (A) NEGATIVE   RBC / HPF 0-5 0 - 5 RBC/hpf   WBC, UA 6-10 0 - 5 WBC/hpf   Bacteria, UA RARE (A) NONE SEEN   Squamous Epithelial / HPF 0-5 0 - 5 /HPF   Mucus PRESENT   Comprehensive metabolic panel   Collection Time: 03/09/24  8:47 PM  Result Value Ref Range   Sodium 140 135 - 145 mmol/L   Potassium 2.8 (L) 3.5 - 5.1 mmol/L   Chloride 106 98 - 111 mmol/L   CO2 23 22 - 32 mmol/L   Glucose, Bld 145 (H) 70 - 99 mg/dL   BUN 13 8 - 23 mg/dL   Creatinine, Ser 6.57 (H) 0.44 - 1.00 mg/dL   Calcium 9.7 8.9 - 84.6 mg/dL   Total Protein 7.5 6.5 - 8.1 g/dL   Albumin 4.3 3.5 - 5.0 g/dL   AST 25 15 - 41 U/L   ALT 17 0 - 44 U/L   Alkaline Phosphatase 43 38 - 126 U/L   Total Bilirubin 0.5 0.0 - 1.2 mg/dL   GFR, Estimated 48 (L) >60 mL/min   Anion gap 11 5 - 15  CBC   Collection Time: 03/09/24  8:47 PM  Result Value Ref Range   WBC 5.8 4.0 - 10.5 K/uL   RBC 4.72 3.87 - 5.11 MIL/uL   Hemoglobin 13.2 12.0 - 15.0 g/dL   HCT 96.2 95.2 - 84.1 %   MCV 80.1 80.0 - 100.0 fL   MCH 28.0 26.0 - 34.0 pg   MCHC 34.9 30.0 - 36.0 g/dL   RDW 32.4 40.1 - 02.7 %   Platelets 293 150 - 400 K/uL   nRBC 0.0 0.0 - 0.2 %   No results found.   Radiology No results found.  Procedures Procedures  {Document cardiac monitor, telemetry assessment procedure when appropriate:1}  Medications Ordered in ED Medications  potassium  chloride SA (KLOR-CON M) CR tablet 40 mEq (has no administration in time range)  ondansetron (ZOFRAN-ODT) disintegrating tablet 4 mg (4 mg Oral Given 03/09/24 2043)    ED Course/ Medical Decision Making/ A&P   {   Click here for ABCD2, HEART and other calculatorsREFRESH Note before signing :1}                              Medical Decision Making Patient with sudden onset diarrhea, and then vomiting x 1.  Family ate same thing but no symptoms   Amount and/or Complexity of Data Reviewed Independent Historian:     Details: Daughter see above  External Data Reviewed: notes.    Details: Previous notes reviewed  Labs: ordered.    Details: Normal sodium 140, low potassium 2.8, creatinine slight elevation 1.2, normal white count 5.8, normal hemoglobin 13.2, normal platelets.    Risk Prescription drug management. Risk Details: Well appearing.  As no family was sickened by the same meal I suspect this is a viral etiology as opposed to food poisoning.  Emesis and diarrhea x 1 only.  No further symptoms.  Potassium given in the ED with outpatient RX for potassium.  Stable for discharge.      Final Clinical Impression(s) / ED Diagnoses No signs of systemic illness or infection. The patient is nontoxic-appearing on exam and vital signs are within normal limits.  I have reviewed the triage vital signs and the nursing notes. Pertinent labs & imaging results that were available during my care of the patient were reviewed by me and considered in my medical decision making (see chart for details). After history, exam, and medical workup I feel the patient has been appropriately medically screened and is safe for discharge home. Pertinent diagnoses were discussed with the patient. Patient was given return precaution

## 2024-03-10 ENCOUNTER — Other Ambulatory Visit: Payer: Self-pay | Admitting: Student

## 2024-03-10 ENCOUNTER — Ambulatory Visit: Payer: Self-pay

## 2024-03-10 DIAGNOSIS — R110 Nausea: Secondary | ICD-10-CM

## 2024-03-10 MED ORDER — ONDANSETRON 8 MG PO TBDP: ORAL_TABLET | ORAL | 0 refills | Status: DC

## 2024-03-10 MED ORDER — POTASSIUM CHLORIDE CRYS ER 20 MEQ PO TBCR: 20.0000 meq | EXTENDED_RELEASE_TABLET | Freq: Two times a day (BID) | ORAL | 0 refills | Status: DC

## 2024-03-10 NOTE — Telephone Encounter (Signed)
  Chief Complaint: nausea Symptoms: nausea Frequency: yesterday Pertinent Negatives: Patient denies fever, vomiting, SOB, CP, dizziness,  Disposition: [] ED /[] Urgent Care (no appt availability in office) / [] Appointment(In office/virtual)/ []  [facility]/ [] Home Care/ [] Refused Recommended Disposition /[] [facility]/ []  Follow-up with PCP Additional Notes: Pt states that she was nauseous, dizzy, and suffering from diarrhea yesterday. Pt was evaluated in the ED, was told she had a "stomach bug and low potassium". Pt states that they gave her zofran in the ED and her nausea was relieved until this morning when she took her potassium pills. Pt states that since she took the medication she is now nauseous again. Pt states that she did eat with the medication. Pt states that she was also told yesterday in the ED that she would get medication for nausea at home. Pt states that she never got prescription for the at home nausea medication. Pt states that she would like her PCP to write that for her. Offered to schedule pt for ED f/u, pt declined. Pt would like an rx for an antemetic and also if there is a different way she can take the potassium. Copied from CRM [id number]. Topic: Clinical - Red Word Triage >> Mar 10, 2024  2:44 PM Corin V wrote: [facility] that prompted transfer to Nurse Triage: Patient went to EMERGENCY ROOM last night for flu symptoms and was diagnosed with low potassium. She was given a potassium supplement to take and it is causing severe nausea today. Reason for Disposition  Unexplained nausea  Answer Assessment - Initial Assessment Questions 1. NAUSEA SEVERITY: "How bad is the nausea?" (e.g., mild, moderate, severe; dehydration, weight loss)   - MILD: loss of appetite without change in eating habits   - MODERATE: decreased oral intake without significant weight loss, dehydration, or malnutrition   - SEVERE: inadequate caloric or fluid intake, significant weight  loss, symptoms of dehydration     Mild-moderate, worse with K+ pill 2. ONSET: "When did the nausea begin?"     Yesterday, went to ED 3. VOMITING: "Any vomiting?" If Yes, ask: "How many times today?"     denies 4. RECURRENT SYMPTOM: "Have you had nausea before?" If Yes, ask: "When was the last time?" "What happened that time?"     denies 5. CAUSE: "What do you think is causing the nausea?"     Was dx with stomach flu yesterday at the ED, nausea was resolved, today took the K+ pill and nausea has restarted.  Protocols used: Nausea-A-AH

## 2024-03-10 NOTE — Telephone Encounter (Signed)
 Patient called after hours pager but routed to junior pager. Confirmed name and date of birth for patient. Patient seen in ED yesterday. She was prescribed Zofran and potassium but never received the Zofran. Prescription sent in to 24 hours pharmacy at [facility] per request of patient.

## 2024-03-13 ENCOUNTER — Other Ambulatory Visit: Payer: Self-pay | Admitting: Student

## 2024-03-13 ENCOUNTER — Other Ambulatory Visit: Payer: Self-pay | Admitting: Internal Medicine

## 2024-03-13 DIAGNOSIS — R110 Nausea: Secondary | ICD-10-CM

## 2024-03-13 MED ORDER — TRIMETHOBENZAMIDE HCL 300 MG PO CAPS: 300.0000 mg | ORAL_CAPSULE | Freq: Three times a day (TID) | ORAL | 0 refills | Status: DC | PRN

## 2024-03-13 MED ORDER — TRIMETHOBENZAMIDE HCL 300 MG PO CAPS: 300.0000 mg | ORAL_CAPSULE | Freq: Three times a day (TID) | ORAL | 0 refills | Status: AC | PRN

## 2024-03-13 NOTE — Progress Notes (Signed)
 Pt called after hours to request refills on Zofran. She states she is taking potassium supplement which causes her nausea and needs this medication to help prevent the nausea so she can finish her potassium supplement. Pt was in the ED on 04/10 for GI complaints that have resolved except when she takes the potassium supplement. Pt's K was 2.8 in the ED. I reviewed her EKG and she had a prolonged Qtc. Discussed trying an alternative medication with minimal effect on Qtc. She was agreeable. Discussed to schedule an appointment with our clinic to repeat blood work and potentially EKG. Pt stated she had limited availability this week but was available tomorrow morning. Appointment available for 8:45 in the am with Dr. Jari Merles. Unsure if pt can be accommodated at that time but advised her to come in to see if that can be done. If not, she can get repeat BMP and her magnesium checked. Will send in short course of Tigan for her nausea. Pt reports improved appetite and resolution of her nausea except when taking her potassium supplementation. On chart review pt with repeated episodes of significant hypokalemia. She had hyperaldosteronism testing done but appears results are equivocal with low renin activity but elevated ratio. This was done inpatient so might need to be repeated at some point. Will defer for now until pt's potassium is normal.  Jackolyn Masker, MD Tommas Fragmin. [facility] Internal Medicine Residency, PGY-3

## 2024-03-13 NOTE — Telephone Encounter (Signed)
 Copied from CRM [id number]. Topic: Clinical - Medication Refill >> Mar 13, 2024 11:00 AM Jamas Maywood wrote: Most Recent Primary Care Visit:  Provider: [facility]  Department: [facility]  Visit Type: LAB 30  Date: 03/06/2024  Medication: ondansetron (ZOFRAN-ODT) 8 MG disintegrating tablet  Has the patient contacted their pharmacy? Yes (Agent: If no, request that the patient contact the pharmacy for the refill. If patient does not wish to contact the pharmacy document the reason why and proceed with request.) (Agent: If yes, when and what did the pharmacy advise?)  Is this the correct pharmacy for this prescription? Yes If no, delete pharmacy and type the correct one.  This is the patient's preferred pharmacy:  [id number] - [place] - [address] [place]) [place] [address] Phone: [phone] Fax: [phone]  Has the prescription been filled recently? Yes  Is the patient out of the medication? Yes  Has the patient been seen for an appointment in the last year OR does the patient have an upcoming appointment? Yes  Can we respond through MyChart? Yes  Agent: Please be advised that Rx refills may take up to 3 business days. We ask that you follow-up with your pharmacy.

## 2024-03-13 NOTE — Telephone Encounter (Signed)
 Copied from CRM [id number]. Topic: Clinical - Medication Refill >> Mar 13, 2024 11:05 AM Jamas Maywood wrote: Most Recent Primary Care Visit:  Provider: [facility]  Department: [facility]  Visit Type: LAB 30  Date: 03/06/2024  Medication: ondansetron (ZOFRAN-ODT) 8 MG disintegrating tablet  Has the patient contacted their pharmacy? Yes (Agent: If no, request that the patient contact the pharmacy for the refill. If patient does not wish to contact the pharmacy document the reason why and proceed with request.) (Agent: If yes, when and what did the pharmacy advise?)  Is this the correct pharmacy for this prescription? Yes If no, delete pharmacy and type the correct one.  This is the patient's preferred pharmacy:   [id number] - [place], [place] - [address] AT [facility] OF [address] & [address] [place] [address] Phone: [phone] Fax: [phone]   Has the prescription been filled recently? Yes  Is the patient out of the medication? Yes  Has the patient been seen for an appointment in the last year OR does the patient have an upcoming appointment? Yes  Can we respond through MyChart? Yes  Agent: Please be advised that Rx refills may take up to 3 business days. We ask that you follow-up with your pharmacy.

## 2024-03-14 ENCOUNTER — Ambulatory Visit (INDEPENDENT_AMBULATORY_CARE_PROVIDER_SITE_OTHER): Admitting: Student

## 2024-03-14 ENCOUNTER — Encounter: Payer: Self-pay | Admitting: Student

## 2024-03-14 VITALS — BP 126/64 | HR 77 | Temp 97.6°F | Ht 66.0 in | Wt 175.5 lb

## 2024-03-14 DIAGNOSIS — R110 Nausea: Secondary | ICD-10-CM | POA: Diagnosis not present

## 2024-03-14 DIAGNOSIS — E876 Hypokalemia: Secondary | ICD-10-CM | POA: Diagnosis not present

## 2024-03-14 DIAGNOSIS — N1831 Chronic kidney disease, stage 3a: Secondary | ICD-10-CM

## 2024-03-14 DIAGNOSIS — R7303 Prediabetes: Secondary | ICD-10-CM

## 2024-03-14 DIAGNOSIS — E7800 Pure hypercholesterolemia, unspecified: Secondary | ICD-10-CM

## 2024-03-14 DIAGNOSIS — I10 Essential (primary) hypertension: Secondary | ICD-10-CM

## 2024-03-14 DIAGNOSIS — E785 Hyperlipidemia, unspecified: Secondary | ICD-10-CM

## 2024-03-14 NOTE — Assessment & Plan Note (Signed)
 Last LDL 151 in 01/2023. Taking rosuvastatin 20 mg daily. The 10-year ASCVD risk score (Arnett DK, et al., 2019) is: 14.8%  Plan -repeat lipid panel today -continue rosuvastatin

## 2024-03-14 NOTE — Assessment & Plan Note (Addendum)
 Seen in ED on 4/10 for n/v/d suspected then to be viral etiology, d/c with oral K for hypokalemia. Symptoms resolved except had nausea when taking the potassium pill. No other times of nausea or associated symptoms. Called overnight for refill of anti-emetic due to completing oral K Rx still from ED. 2019 EKG with prolong QT so Tigan was sent to pharmacy overnight. After measuring QT on prior EKG noted to be around 460.   Today she is feeling well without any other acute concerns. No nausea or vomiting at this time. Still has 1 day of oral K left.   Plan -already has Tigan Rx at pharmacy but at this time denies n/v -symptoms resolved, monitor as needed -BMP to recheck K, hold off further oral K

## 2024-03-14 NOTE — Progress Notes (Signed)
 CC: ED f/u  HPI:  [name] is a 73 y.o. female living with a history stated below and presents today for ED f/u. Please see problem based assessment and plan for additional details.  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  Has resolved with omeprazole.  Hgb, mcv now up trending with iron replacement.      Current Outpatient Medications on File Prior to Visit  Medication Sig Dispense Refill   amLODipine (NORVASC) 10 MG tablet Take 1 tablet (10 mg total) by mouth daily. 90 tablet 2   losartan (COZAAR) 50 MG tablet Take 1 tablet (50 mg total) by mouth daily. 90 tablet 3   ondansetron (ZOFRAN-ODT) 8 MG disintegrating tablet 8mg  ODT q hours prn nausea 4 tablet 0   potassium chloride SA (KLOR-CON M) 20 MEQ tablet Take 1 tablet (20 mEq total) by mouth 2 (two) times daily. 10 tablet 0   rosuvastatin (CRESTOR) 20 MG tablet Take 1 tablet (20 mg total) by mouth daily. 90 tablet 3   trimethobenzamide (TIGAN) 300 MG capsule Take 1 capsule (300 mg total) by mouth 3 (three) times daily as needed for up to 7 days for nausea/vomiting. 21 capsule 0   No current facility-administered medications on file prior to visit.    No family history on file.  Social History   Socioeconomic History   Marital status: Widowed    Spouse name: Not on file   Number of children: Not on file   Years of education: Not on file   Highest education level: Not on file  Occupational History   Not on file  Tobacco Use   Smoking status: Never   Smokeless tobacco: Never  Vaping Use   Vaping status: Never Used  Substance and Sexual Activity   Alcohol use: Not Currently    Drug use: Never   Sexual activity: Yes  Other Topics Concern   Not on file  Social History Narrative   Not on file   Social Drivers of Health   Financial Resource Strain: Low Risk  (02/01/2023)   Overall Financial Resource Strain (CARDIA)    Difficulty of Paying Living Expenses: Not hard at all  Food Insecurity: No Food Insecurity (02/01/2023)   Hunger Vital Sign    Worried About Running Out of Food in the Last Year: Never true    Ran Out of Food in the Last Year: Never true  Transportation Needs: No Transportation Needs (02/01/2023)   PRAPARE - Administrator, Civil Service (Medical): No    Lack of Transportation (Non-Medical): No  Physical Activity: Sufficiently Active (02/01/2023)   Exercise Vital Sign    Days of Exercise per Week: 5 days    Minutes of Exercise per Session: 60 min  Stress: No Stress Concern Present (02/01/2023)   [employer] of Occupational Health - Occupational Stress Questionnaire    Feeling of Stress : Not at all  Social Connections: Socially Isolated (02/01/2023)   Social Connection and Isolation Panel [NHANES]    Frequency of Communication with Friends and Family: More than three times a week  Frequency of Social Gatherings with Friends and Family: More than three times a week    Attends Religious Services: Never    Database administrator or Organizations: No    Attends Banker Meetings: Never    Marital Status: Separated  Intimate Partner Violence: Not At Risk (02/01/2023)   Humiliation, Afraid, Rape, and Kick questionnaire    Fear of Current or Ex-Partner: No    Emotionally Abused: No    Physically Abused: No    Sexually Abused: No    Review of Systems: ROS negative except for what is noted on the assessment and plan.  Vitals:   03/14/24 0838  BP: 126/64  Pulse: 77  Temp: 97.6 F (36.4 C)  TempSrc: Oral  SpO2: 99%  Weight: 175 lb 8 oz (79.6 kg)  Height: 5\' 6"  (1.676 m)   Physical Exam: Constitutional:  well-appearing female sitting in chair, in no acute distress Cardiovascular: regular rate and rhythm Pulmonary/Chest: normal work of breathing on room air Abdominal: soft, non-tender, non-distended Neurological: alert & oriented x 3  Assessment & Plan:   Hypokalemia ED visit on 4/10 for n/v/d which has since resolved suspected at that time to be viral etiology. Noted K of 2.8 then, was given oral K in ED along with Rx for 40 mEq x 5 days, noted to have nausea with taking K pill which was why she contacted overnight for anti-emetic Rx. Asymptomatic today and feeling well. No nausea at all except when taking the oral K.   Plan -BMP today  -Hold oral K since got 4 days worth plus ED dose already   Hyperlipidemia Last LDL 151 in 01/2023. Taking rosuvastatin 20 mg daily. The 10-year ASCVD risk score (Arnett DK, et al., 2019) is: 14.8%  Plan -repeat lipid panel today -continue rosuvastatin   Nausea Seen in ED on 4/10 for n/v/d suspected then to be viral etiology, d/c with oral K for hypokalemia. Symptoms resolved except had nausea when taking the potassium pill. No other times of nausea or associated symptoms. Called overnight for refill of anti-emetic due to completing oral K Rx still from ED. 2019 EKG with prolong QT so Tigan was sent to pharmacy overnight. After measuring QT on prior EKG noted to be around 460.   Today she is feeling well without any other acute concerns. No nausea or vomiting at this time. Still has 1 day of oral K left.   Plan -already has Tigan Rx at pharmacy but at this time denies n/v -symptoms resolved, monitor as needed -BMP to recheck K, hold off further oral K   Patient discussed with Dr.  Alda Hummer, D.O. [facility] Internal Medicine, PGY-2 Phone: [phone] Date 03/14/2024 Time 8:45 AM

## 2024-03-14 NOTE — Patient Instructions (Signed)
 Thank you, Kayla Williams for allowing us  to provide your care today. Today we discussed   -Recheck your potassium today along with cholesterol and pre-diabetes -Hold off on further potassium pills until blood work comes back -Glad no further nausea or vomiting.     I have ordered the following labs for you: Lab Orders         BMP8+Anion Gap         Lipid Profile         Hemoglobin A1c      Follow up: 4-6 months   Should you have any questions or concerns please call the internal medicine clinic at [phone].    Kayla Williams, D.O. [facility]

## 2024-03-14 NOTE — Assessment & Plan Note (Signed)
 ED visit on 4/10 for n/v/d which has since resolved suspected at that time to be viral etiology. Noted K of 2.8 then, was given oral K in ED along with Rx for 40 mEq x 5 days, noted to have nausea with taking K pill which was why she contacted overnight for anti-emetic Rx. Asymptomatic today and feeling well. No nausea at all except when taking the oral K.   Plan -BMP today  -Hold oral K since got 4 days worth plus ED dose already

## 2024-03-15 LAB — LIPID PANEL
Chol/HDL Ratio: 2.7 ratio (ref 0.0–4.4)
Cholesterol, Total: 134 mg/dL (ref 100–199)
HDL: 49 mg/dL (ref >39)
LDL Chol Calc (NIH): 73 mg/dL (ref 0–99)
Triglycerides: 56 mg/dL (ref 0–149)
VLDL Cholesterol Cal: 12 mg/dL (ref 5–40)

## 2024-03-15 LAB — BMP8+ANION GAP
Anion Gap: 16 mmol/L (ref 10.0–18.0)
BUN/Creatinine Ratio: 13 (ref 12–28)
BUN: 18 mg/dL (ref 8–27)
CO2: 22 mmol/L (ref 20–29)
Calcium: 10.1 mg/dL (ref 8.7–10.3)
Chloride: 102 mmol/L (ref 96–106)
Creatinine, Ser: 1.36 mg/dL — ABNORMAL HIGH (ref 0.57–1.00)
Glucose: 98 mg/dL (ref 70–99)
Potassium: 4.8 mmol/L (ref 3.5–5.2)
Sodium: 140 mmol/L (ref 134–144)
eGFR: 41 mL/min/{1.73_m2} — ABNORMAL LOW (ref >59)

## 2024-03-15 LAB — HEMOGLOBIN A1C
Est. average glucose Bld gHb Est-mCnc: 126 mg/dL
Hgb A1c MFr Bld: 6 % — ABNORMAL HIGH (ref 4.8–5.6)

## 2024-03-15 NOTE — Progress Notes (Signed)
 Internal Medicine Clinic Attending  Case discussed with the resident at the time of the visit.  We reviewed the resident's history and exam and pertinent patient test results.  I agree with the assessment, diagnosis, and plan of care documented in the resident's note.

## 2024-04-04 ENCOUNTER — Encounter (HOSPITAL_COMMUNITY): Payer: Self-pay | Admitting: Emergency Medicine

## 2024-04-04 ENCOUNTER — Emergency Department (HOSPITAL_COMMUNITY)

## 2024-04-04 ENCOUNTER — Emergency Department (HOSPITAL_COMMUNITY)
Admission: EM | Admit: 2024-04-04 | Discharge: 2024-04-04 | Disposition: A | Discharge status: Home / Self Care | Attending: Emergency Medicine | Admitting: Emergency Medicine

## 2024-04-04 ENCOUNTER — Other Ambulatory Visit: Payer: Self-pay

## 2024-04-04 DIAGNOSIS — Z79899 Other long term (current) drug therapy: Secondary | ICD-10-CM | POA: Insufficient documentation

## 2024-04-04 DIAGNOSIS — R42 Dizziness and giddiness: Secondary | ICD-10-CM | POA: Insufficient documentation

## 2024-04-04 DIAGNOSIS — I10 Essential (primary) hypertension: Secondary | ICD-10-CM | POA: Insufficient documentation

## 2024-04-04 DIAGNOSIS — E876 Hypokalemia: Secondary | ICD-10-CM | POA: Diagnosis not present

## 2024-04-04 LAB — CBC
HCT: 37.3 % (ref 36.0–46.0)
Hemoglobin: 13 g/dL (ref 12.0–15.0)
MCH: 27.6 pg (ref 26.0–34.0)
MCHC: 34.9 g/dL (ref 30.0–36.0)
MCV: 79.2 fL — ABNORMAL LOW (ref 80.0–100.0)
Platelets: 283 10*3/uL (ref 150–400)
RBC: 4.71 MIL/uL (ref 3.87–5.11)
RDW: 13.8 % (ref 11.5–15.5)
WBC: 6.7 10*3/uL (ref 4.0–10.5)
nRBC: 0 % (ref 0.0–0.2)

## 2024-04-04 LAB — URINALYSIS, ROUTINE W REFLEX MICROSCOPIC
Bilirubin Urine: NEGATIVE
Glucose, UA: 150 mg/dL — AB
Hgb urine dipstick: NEGATIVE
Ketones, ur: NEGATIVE mg/dL
Leukocytes,Ua: NEGATIVE
Nitrite: NEGATIVE
Protein, ur: NEGATIVE mg/dL
Specific Gravity, Urine: 1.008 (ref 1.005–1.030)
pH: 8 (ref 5.0–8.0)

## 2024-04-04 LAB — BASIC METABOLIC PANEL WITH GFR
Anion gap: 14 (ref 5–15)
BUN: 14 mg/dL (ref 8–23)
CO2: 22 mmol/L (ref 22–32)
Calcium: 9.8 mg/dL (ref 8.9–10.3)
Chloride: 102 mmol/L (ref 98–111)
Creatinine, Ser: 0.87 mg/dL (ref 0.44–1.00)
GFR, Estimated: >60 mL/min (ref >60)
Glucose, Bld: 187 mg/dL — ABNORMAL HIGH (ref 70–99)
Potassium: 3.1 mmol/L — ABNORMAL LOW (ref 3.5–5.1)
Sodium: 138 mmol/L (ref 135–145)

## 2024-04-04 LAB — TROPONIN I (HIGH SENSITIVITY): Troponin I (High Sensitivity): 4 ng/L (ref <18)

## 2024-04-04 MED ORDER — ONDANSETRON 4 MG PO TBDP: 4.0000 mg | ORAL_TABLET | Freq: Three times a day (TID) | ORAL | 0 refills | Status: DC | PRN

## 2024-04-04 MED ORDER — POTASSIUM CHLORIDE CRYS ER 20 MEQ PO TBCR
20.0000 meq | EXTENDED_RELEASE_TABLET | Freq: Once | ORAL | Status: AC
  Administered 2024-04-04: 20 meq via ORAL
  Filled 2024-04-04: qty 1

## 2024-04-04 MED ORDER — POTASSIUM CHLORIDE ER 10 MEQ PO TBCR: 10.0000 meq | EXTENDED_RELEASE_TABLET | Freq: Every day | ORAL | 0 refills | Status: DC

## 2024-04-04 MED ORDER — ONDANSETRON HCL 4 MG/2ML IJ SOLN
4.0000 mg | Freq: Once | INTRAMUSCULAR | Status: AC
  Administered 2024-04-04: 4 mg via INTRAVENOUS
  Filled 2024-04-04: qty 2

## 2024-04-04 NOTE — ED Notes (Signed)
 Patient transported to MRI

## 2024-04-04 NOTE — ED Provider Notes (Signed)
 [place] EMERGENCY DEPARTMENT AT [facility] Provider Note   CSN: [id number] Arrival date & time: 04/04/24  0054     History  Chief Complaint  Patient presents with   Dizziness    Kayla Williams is a 73 y.o. female.  The history is provided by the patient and a relative.  Dizziness Kayla Williams is a 73 y.o. female who presents to the Emergency Department complaining of dizziness.  She presents to the emergency department for evaluation of symptoms that started around 9 PM.  She went on a walk with her daughter and felt very well and when she returned home she felt dizzy and said that she could not walk well and was weak in general.  She had a racing feeling in her chest and had vomiting and diarrhea.  Overall her symptoms are improving but are still present.  No chest pain, headache, abdominal pain, cough, fever, leg swelling.  Symptoms are still present but overall improving.  She had similar symptoms in the past secondary to low potassium earlier in the month.  She did have an energy drink with potassium prior to ED arrival to see if it would make it better.  She has a history of hypertension and hyperlipidemia.  Hx/o htn, HPL.  No tobacco, alcohol, drugs.  Lives alone. Walks indepently.   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    Home Medications Prior to Admission medications   Medication Sig Start Date End Date Taking? Authorizing Provider  ondansetron  (ZOFRAN -ODT) 4 MG disintegrating tablet Take 1 tablet (4 mg total) by mouth every 8 (eight) hours as needed. 04/04/24  Yes Kelsey Patricia, MD  potassium chloride  (KLOR-CON ) 10 MEQ tablet Take 1 tablet (10 mEq total) by mouth daily. 04/04/24  Yes Kelsey Patricia, MD  amLODipine  (NORVASC ) 10 MG tablet Take 1 tablet (10 mg total) by mouth daily. 12/27/23   Arellano Zameza, Priscila, MD  losartan  (COZAAR ) 50 MG tablet Take 1 tablet (50 mg total) by mouth daily. 12/27/23 12/26/24   Arellano Zameza, Priscila, MD  potassium chloride  SA (KLOR-CON  M) 20 MEQ tablet Take 1 tablet (20 mEq total) by mouth 2 (two) times daily. 03/10/24   Palumbo, April, MD  rosuvastatin  (CRESTOR ) 20 MG tablet Take 1 tablet (20 mg total) by mouth daily. 12/27/23 12/26/24  Arellano Zameza, Priscila, MD      Allergies    Lisinopril     Review of Systems   Review of Systems  Neurological:  Positive for dizziness.  All other systems reviewed and are negative.   Physical Exam Updated Vital Signs BP (!) 147/79 (BP Location: Right Arm)   Pulse 75   Temp 98 F (36.7 C)   Resp 17   Ht 5\' 6"  (1.676 m)   Wt 79.4 kg   SpO2 100%   BMI 28.25 kg/m  Physical Exam Vitals and nursing note reviewed.  Constitutional:      Appearance: She is well-developed.  HENT:     Head: Normocephalic and atraumatic.  Cardiovascular:     Rate and Rhythm: Normal rate and regular rhythm.     Heart sounds: No murmur heard. Pulmonary:     Effort: Pulmonary effort is normal. No respiratory distress.     Breath sounds: Normal breath sounds.  Abdominal:     Palpations: Abdomen is soft.     Tenderness: There is no abdominal tenderness. There is no guarding or rebound.  Musculoskeletal:  General: No tenderness.  Skin:    General: Skin is warm and dry.  Neurological:     Mental Status: She is alert and oriented to person, place, and time.     Comments: No asymmetry of facial movements, visual fields grossly intact.  5/5 strength in all four extremities with sensation to light touch intact in all four extremities.   Psychiatric:        Behavior: Behavior normal.     ED Results / Procedures / Treatments   Labs (all labs ordered are listed, but only abnormal results are displayed) Labs Reviewed  BASIC METABOLIC PANEL WITH GFR - Abnormal; Notable for the following components:      Result Value   Potassium 3.1 (*)    Glucose, Bld 187 (*)    All other components within normal limits  CBC - Abnormal; Notable  for the following components:   MCV 79.2 (*)    All other components within normal limits  URINALYSIS, ROUTINE W REFLEX MICROSCOPIC - Abnormal; Notable for the following components:   Color, Urine COLORLESS (*)    Glucose, UA 150 (*)    All other components within normal limits  TROPONIN I (HIGH SENSITIVITY)    EKG EKG Interpretation Date/Time:  Tuesday Apr 04 2024 01:13:03 EDT Ventricular Rate:  78 PR Interval:  170 QRS Duration:  94 QT Interval:  406 QTC Calculation: 462 R Axis:   66  Text Interpretation: Normal sinus rhythm Cannot rule out Anterior infarct , age undetermined ST & T wave abnormality, consider inferior ischemia Abnormal ECG When compared with ECG of 10-Mar-2018 03:25, PREVIOUS ECG IS PRESENT Confirmed by Townsend Freud [phone]) on 04/04/2024 1:23:12 AM  Radiology MR BRAIN WO CONTRAST Result Date: 04/04/2024 CLINICAL DATA:  Neuro deficit with stroke suspected.  Vertigo. EXAM: MRI HEAD WITHOUT CONTRAST TECHNIQUE: Multiplanar, multiecho pulse sequences of the brain and surrounding structures were obtained without intravenous contrast. COMPARISON:  None Available. FINDINGS: Brain: No acute infarction, hemorrhage, hydrocephalus, extra-axial collection or mass lesion. Age normal brain volume and white matter appearance. Vascular: Normal flow voids Skull and upper cervical spine: Normal marrow signal Sinuses/Orbits: Minor right mastoid opacification with normal nasopharynx. IMPRESSION: Unremarkable MRI of the brain. Electronically Signed   By: Ronnette Coke M.D.   On: 04/04/2024 07:06    Procedures Procedures    Medications Ordered in ED Medications  ondansetron  (ZOFRAN ) injection 4 mg (has no administration in time range)  potassium chloride  SA (KLOR-CON  M) CR tablet 20 mEq (20 mEq Oral Given 04/04/24 0608)    ED Course/ Medical Decision Making/ A&P                                 Medical Decision Making Amount and/or Complexity of Data Reviewed Labs:  ordered. Radiology: ordered.  Risk Prescription drug management.   Patient here for evaluation of dizziness, vomiting, did have 1 soft bowel movement.  EKG is similar when compared to priors, troponin is negative.  Patient without chest pain.  Given her description of dizziness and gait difficulty and MRI was obtained, which is negative for acute abnormality.  No focal deficits on examination.  Her blood pressure was significantly elevated at time of ED arrival, did improve without intervention.  No recurrent nausea or vomiting in the emergency department.  Potassium was replaced orally.  On repeat assessment patient is feeling improved.  She is able to ambulate with a steady gait.  Feel she is stable for discharge home with close outpatient follow-up as well as return precautions.        Final Clinical Impression(s) / ED Diagnoses Final diagnoses:  Dizziness  Hypokalemia    Rx / DC Orders ED Discharge Orders          Ordered    ondansetron  (ZOFRAN -ODT) 4 MG disintegrating tablet  Every 8 hours PRN        04/04/24 0713    potassium chloride  (KLOR-CON ) 10 MEQ tablet  Daily        04/04/24 0714              Kelsey Patricia, MD 04/04/24 [phone]

## 2024-04-04 NOTE — ED Triage Notes (Addendum)
 Patient coming to ED for evaluation of dizziness and chills.  Reports she was seen here a month ago and dx with low potassium.  States "I feel like I did the last time I was here."  Potassium was 2.8 at time of last visit.  Pt reports drinking "an energy drink with potassium in it" prior to arrival.    Pt reports symptoms started tonight after taking a walk outside.  States she was feeling normal and when she returned from walk she began to "feel like I did the last time."  No reports of nausea or vomiting.  No diarrhea

## 2024-04-04 NOTE — ED Notes (Signed)
 Patient returned from MRI.

## 2024-04-05 ENCOUNTER — Encounter (HOSPITAL_COMMUNITY): Payer: Self-pay

## 2024-04-11 ENCOUNTER — Telehealth: Payer: Self-pay | Admitting: *Deleted

## 2024-04-11 NOTE — Telephone Encounter (Signed)
 Copied from CRM [id number]. Topic: General - Other >> Apr 11, 2024  1:59 PM Retta Caster wrote: Reason for CRM: Patient calling on clarification for hr Telephone visit on 05/21 wellness. Needs call back on why it it son th phone again not in office.  [phone]

## 2024-04-12 ENCOUNTER — Ambulatory Visit
Admission: RE | Admit: 2024-04-12 | Discharge: 2024-04-12 | Disposition: A | Discharge status: Home / Self Care | Source: Ambulatory Visit | Attending: Internal Medicine | Admitting: Internal Medicine

## 2024-04-12 DIAGNOSIS — Z1231 Encounter for screening mammogram for malignant neoplasm of breast: Secondary | ICD-10-CM

## 2024-04-19 ENCOUNTER — Ambulatory Visit (INDEPENDENT_AMBULATORY_CARE_PROVIDER_SITE_OTHER)

## 2024-04-19 VITALS — Ht 66.0 in | Wt 169.0 lb

## 2024-04-19 DIAGNOSIS — Z0000 Encounter for general adult medical examination without abnormal findings: Secondary | ICD-10-CM

## 2024-04-19 NOTE — Progress Notes (Signed)
 Because this visit was a virtual/telehealth visit,  certain criteria was not obtained, such a blood pressure, CBG if applicable, and timed get up and go. Any medications not marked as "taking" were not mentioned during the medication reconciliation part of the visit. Any vitals not documented were not able to be obtained due to this being a telehealth visit or patient was unable to self-report a recent blood pressure reading due to a lack of equipment at home via telehealth. Vitals that have been documented are verbally provided by the patient.   Subjective:   Kayla Williams is a 73 y.o. who presents for a Medicare Wellness preventive visit.  As a reminder, Annual Wellness Visits don't include a physical exam, and some assessments may be limited, especially if this visit is performed virtually. We may recommend an in-person follow-up visit with your provider if needed.  Visit Complete: Virtual I connected with  Kayla Williams on 04/19/24 by a audio enabled telemedicine application and verified that I am speaking with the correct person using two identifiers.  Patient Location: Home  Provider Location: Office/Clinic  I discussed the limitations of evaluation and management by telemedicine. The patient expressed understanding and agreed to proceed.  Vital Signs: Because this visit was a virtual/telehealth visit, some criteria may be missing or patient reported. Any vitals not documented were not able to be obtained and vitals that have been documented are patient reported.  VideoDeclined- This patient declined Librarian, academic. Therefore the visit was completed with audio only.  Persons Participating in Visit: Patient.  AWV Questionnaire: No: Patient Medicare AWV questionnaire was not completed prior to this visit.  Cardiac Risk Factors include: advanced age (>34men, >36 women);dyslipidemia;family history of premature cardiovascular  disease;hypertension     Objective:     Today's Vitals   04/19/24 1113  Weight: 169 lb (76.7 kg)  Height: 5\' 6"  (1.676 m)  PainSc: 0-No pain   Body mass index is 27.28 kg/m.     04/19/2024   11:15 AM 04/04/2024    1:11 AM 03/09/2024    8:39 PM 03/04/2023    9:19 AM 02/01/2023    2:28 PM 02/01/2023   10:48 AM 03/19/2022    1:18 PM  Advanced Directives  Does Patient Have a Medical Advance Directive? No No No No No No No  Would patient like information on creating a medical advance directive? No - Patient declined No - Patient declined  No - Patient declined No - Patient declined No - Patient declined No - Patient declined    Current Medications (verified) Outpatient Encounter Medications as of 04/19/2024  Medication Sig   amLODipine  (NORVASC ) 10 MG tablet Take 1 tablet (10 mg total) by mouth daily.   losartan  (COZAAR ) 50 MG tablet Take 1 tablet (50 mg total) by mouth daily.   ondansetron  (ZOFRAN -ODT) 4 MG disintegrating tablet Take 1 tablet (4 mg total) by mouth every 8 (eight) hours as needed.   potassium chloride  (KLOR-CON ) 10 MEQ tablet Take 1 tablet (10 mEq total) by mouth daily.   potassium chloride  SA (KLOR-CON  M) 20 MEQ tablet Take 1 tablet (20 mEq total) by mouth 2 (two) times daily.   rosuvastatin  (CRESTOR ) 20 MG tablet Take 1 tablet (20 mg total) by mouth daily.   No facility-administered encounter medications on file as of 04/19/2024.    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  Has resolved with omeprazole .  Hgb, mcv now up trending with iron  replacement.     Past Surgical History:  Procedure  Laterality Date   FOOT AMPUTATION Right 1994   "for cancer"   History reviewed. No pertinent family history. Social History   Socioeconomic History   Marital status: Widowed    Spouse name: Not on file   Number of children: Not on file   Years of education: Not on file   Highest education level: Not on file  Occupational History   Not on file  Tobacco Use   Smoking status: Never   Smokeless tobacco: Never  Vaping Use   Vaping status: Never Used  Substance and Sexual Activity   Alcohol use: Not Currently   Drug use: Never   Sexual activity: Yes  Other Topics Concern   Not on file  Social History Narrative   Not on file   Social Drivers of Health   Financial Resource Strain: Low Risk  (04/19/2024)   Overall Financial Resource Strain (CARDIA)    Difficulty of Paying Living Expenses: Not hard at all  Food Insecurity: No Food Insecurity (04/19/2024)   Hunger Vital Sign    Worried About Running Out of Food in the Last Year: Never true    Ran Out of Food in the Last Year: Never true  Transportation Needs: No Transportation Needs (04/19/2024)   PRAPARE - Administrator, Civil Service (Medical): No    Lack of Transportation (Non-Medical): No  Physical Activity: Sufficiently Active (04/19/2024)   Exercise Vital Sign    Days of Exercise per Week: 5 days    Minutes of Exercise per Session: 60 min  Stress: No Stress Concern Present (04/19/2024)   [employer] of Occupational Health - Occupational Stress Questionnaire    Feeling of Stress : Not at all  Social Connections: Socially Isolated (04/19/2024)   Social Connection and Isolation Panel [NHANES]    Frequency of Communication with Friends and Family: More than three times a week    Frequency of Social Gatherings with Friends and Family: More than three times a week    Attends Religious Services: Never    Database administrator or Organizations: No    Attends Engineer, structural: Never    Marital  Status: Separated    Tobacco Counseling Counseling given: Not Answered    Clinical Intake:  Pre-visit preparation completed: Yes  Pain : No/denies pain Pain Score: 0-No pain     BMI - recorded: 27.28 Nutritional Status: BMI 25 -29 Overweight Nutritional Risks: None Diabetes: No  Lab Results  Component Value Date   HGBA1C 6.0 (H) 03/14/2024   HGBA1C 5.5 02/01/2023   HGBA1C 5.8 (H) 02/09/2022     How often do you need to have someone help you when you read instructions, pamphlets, or other written materials from your doctor or pharmacy?: 1 - Never  Interpreter Needed?: No  Information entered by :: Brunella Wileman N. Husna Krone, LPN.   Activities of Daily Living     04/19/2024   11:17 AM  In your present state of health, do you have any difficulty performing the following activities:  Hearing? 0  Vision? 0  Difficulty concentrating or making decisions? 0  Walking or climbing stairs? 0  Dressing or bathing? 0  Doing errands, shopping? 0  Preparing Food and eating ? N  Using the Toilet? N  In the past six months, have you accidently leaked urine? N  Do you have problems with loss of bowel control? N  Managing your Medications? N  Managing your Finances? N  Housekeeping or managing your Housekeeping? N    Patient Care Team: Masters, Alston Jerry, DO as PCP - General  Indicate any recent Medical Services you may have received from other than [facility] providers in the past year (date may be approximate).     Assessment:    This is a routine wellness examination for [place].  Hearing/Vision screen Hearing Screening - Comments:: Denies hearing difficulties.  Vision Screening - Comments:: Wears rx glasses - up to date with routine eye exams with [facility] at [address]    Goals Addressed               This Visit's Progress     Patient Stated (pt-stated)        04/19/2024: To have cataract surgery.       Depression Screen     04/19/2024   11:29 AM 03/04/2023     9:51 AM 02/01/2023    2:28 PM 03/19/2022    1:20 PM 02/09/2022    1:18 PM 04/09/2021   11:41 AM 03/05/2020    9:34 AM  PHQ 2/9 Scores  PHQ - 2 Score 0 0 0 0 0 0 0  PHQ- 9 Score 0     0 0    Fall Risk     04/19/2024   11:16 AM 03/04/2023    9:51 AM 02/01/2023    2:28 PM 02/01/2023   10:47 AM 03/19/2022    1:17 PM  Fall Risk   Falls in the past year? 0 0 0 0 1  Number falls in past yr: 0 0 0 0 0  Injury with Fall? 0 0 0 0 0  Risk for fall due to : No Fall Risks History of fall(s) History of fall(s);Impaired balance/gait;No Fall Risks History of fall(s);No Fall Risks;Impaired balance/gait Impaired balance/gait  Follow up Falls evaluation completed Falls evaluation completed Falls evaluation completed;Falls prevention discussed Falls evaluation completed;Falls prevention discussed Falls evaluation completed    MEDICARE RISK AT HOME:  Medicare Risk at Home Any stairs in or around the home?: Yes If so, are there any without handrails?: No Home free of loose throw rugs in walkways, pet beds, electrical cords, etc?: Yes Adequate lighting in your home to reduce risk of falls?: Yes Life alert?: No Use of a cane, walker or w/c?: No Grab bars in the bathroom?: Yes Shower chair or bench in shower?: Yes Elevated toilet seat or a handicapped toilet?: No  TIMED UP AND GO:  Was the test performed?  No  Cognitive Function: 6CIT completed    04/19/2024   11:29 AM  MMSE - Mini Mental State Exam  Not completed: Unable to complete        04/19/2024   11:31 AM 02/01/2023    2:28 PM  6CIT Screen  What Year? 0 points 0 points  What month? 0 points 0 points  What time? 0 points 0 points  Count back from 20 0 points 0 points  Months in reverse 0 points 0 points  Repeat phrase 0 points 0 points  Total Score 0 points 0 points  Immunizations Immunization History  Administered Date(s) Administered   Influenza,inj,Quad PF,6+ Mos 07/25/2018   PFIZER(Purple Top)SARS-COV-2 Vaccination 01/28/2020,  02/19/2020   Pneumococcal Conjugate-13 06/27/2018   Pneumococcal Polysaccharide-23 04/09/2021   Tdap 06/27/2018    Screening Tests Health Maintenance  Topic Date Due   Zoster Vaccines- Shingrix (1 of 2) Never done   COVID-19 Vaccine (3 - Pfizer risk series) 03/18/2020   INFLUENZA VACCINE  06/30/2024   COLON CANCER SCREENING ANNUAL FOBT  03/05/2025   Medicare Annual Wellness (AWV)  04/19/2025   MAMMOGRAM  04/12/2026   Colonoscopy  05/17/2028   DTaP/Tdap/Td (2 - Td or Tdap) 06/27/2028   Pneumonia Vaccine 52+ Years old  Completed   DEXA SCAN  Completed   Hepatitis C Screening  Completed   HPV VACCINES  Aged Out   Meningococcal B Vaccine  Aged Out    Health Maintenance  Health Maintenance Due  Topic Date Due   Zoster Vaccines- Shingrix (1 of 2) Never done   COVID-19 Vaccine (3 - Pfizer risk series) 03/18/2020   Health Maintenance Items Addressed: Yes Patient aware of current care gaps.  Immunization record was verified by [employer].   Additional Screening:  Vision Screening: Recommended annual ophthalmology exams for early detection of glaucoma and other disorders of the eye.  Dental Screening: Recommended annual dental exams for proper oral hygiene  Community Resource Referral / Chronic Care Management: CRR required this visit?  No   CCM required this visit?  No   Plan:    I have personally reviewed and noted the following in the patient's chart:   Medical and social history Use of alcohol, tobacco or illicit drugs  Current medications and supplements including opioid prescriptions. Patient is not currently taking opioid prescriptions. Functional ability and status Nutritional status Physical activity Advanced directives List of other physicians Hospitalizations, surgeries, and ER visits in previous 12 months Vitals Screenings to include cognitive, depression, and falls Referrals and appointments  In addition, I have reviewed and discussed with patient certain  preventive protocols, quality metrics, and best practice recommendations. A written personalized care plan for preventive services as well as general preventive health recommendations were provided to patient.   Margette Sheldon, LPN   1/61/0960   After Visit Summary: (MyChart) Due to this being a telephonic visit, the after visit summary with patients personalized plan was offered to patient via MyChart   Notes: Patient aware of current care gaps.  Immunization record was verified by [employer].

## 2024-04-19 NOTE — Patient Instructions (Signed)
 Ms. Heal , Thank you for taking time out of your busy schedule to complete your Annual Wellness Visit with me. I enjoyed our conversation and look forward to speaking with you again next year. I, as well as your care team,  appreciate your ongoing commitment to your health goals. Please review the following plan we discussed and let me know if I can assist you in the future. Your Game plan/ To Do List    Referrals: If you haven't heard from the office you've been referred to, please reach out to them at the phone provided.   Follow up Visits: Next Medicare AWV with our clinical staff: 04/25/2025 at 11:10 am PHONE VISIT with Nurse   Have you seen your provider in the last 6 months (3 months if uncontrolled diabetes)? Yes Next Office Visit with your provider: Message sent to have office contact you to schedule with Dr. Jannice Mends.  Clinician Recommendations:  Aim for 30 minutes of exercise or brisk walking, 6-8 glasses of water, and 5 servings of fruits and vegetables each day.       This is a list of the screening recommended for you and due dates:  Health Maintenance  Topic Date Due   Zoster (Shingles) Vaccine (1 of 2) Never done   COVID-19 Vaccine (3 - Pfizer risk series) 03/18/2020   Flu Shot  06/30/2024   Stool Blood Test  03/05/2025   Medicare Annual Wellness Visit  04/19/2025   Mammogram  04/12/2026   Colon Cancer Screening  05/17/2028   DTaP/Tdap/Td vaccine (2 - Td or Tdap) 06/27/2028   Pneumonia Vaccine  Completed   DEXA scan (bone density measurement)  Completed   Hepatitis C Screening  Completed   HPV Vaccine  Aged Out   Meningitis B Vaccine  Aged Out    Advanced directives: (Declined) Advance directive discussed with you today. Even though you declined this today, please call our office should you change your mind, and we can give you the proper paperwork for you to fill out. Advance Care Planning is important because it:  [x]  Makes sure you receive the medical care that is  consistent with your values, goals, and preferences  [x]  It provides guidance to your family and loved ones and reduces their decisional burden about whether or not they are making the right decisions based on your wishes.  Follow the link provided in your after visit summary or read over the paperwork we have mailed to you to help you started getting your Advance Directives in place. If you need assistance in completing these, please reach out to us  so that we can help you!  See attachments for Preventive Care and Fall Prevention Tips.

## 2024-04-20 NOTE — Progress Notes (Signed)
 This visit was performed by a medical professional under my direct supervision. I was immediately available for consultation/collaboration. I have reviewed and agree with the Annual Wellness Visit documentation.

## 2024-04-25 NOTE — Progress Notes (Signed)
 Internal Medicine Attending:  I reviewed the AWV findings of the medical professional who conducted the visit. I was present in the office suite and immediately available to provide assistance and direction throughout the time the service was provided.

## 2024-04-26 ENCOUNTER — Encounter: Payer: Self-pay | Admitting: Student

## 2024-04-26 ENCOUNTER — Ambulatory Visit (INDEPENDENT_AMBULATORY_CARE_PROVIDER_SITE_OTHER): Admitting: Student

## 2024-04-26 ENCOUNTER — Other Ambulatory Visit: Payer: Self-pay

## 2024-04-26 VITALS — BP 132/65 | HR 71 | Temp 97.8°F | Ht 66.0 in | Wt 176.0 lb

## 2024-04-26 DIAGNOSIS — E876 Hypokalemia: Secondary | ICD-10-CM

## 2024-04-26 DIAGNOSIS — R002 Palpitations: Secondary | ICD-10-CM | POA: Insufficient documentation

## 2024-04-26 DIAGNOSIS — N1831 Chronic kidney disease, stage 3a: Secondary | ICD-10-CM

## 2024-04-26 NOTE — Progress Notes (Signed)
 CC: Nocturnal palpitations  HPI: [name] is a 73 y.o. female living with a history stated below and presents today for nocturnal palpitations. Please see problem based assessment and plan for additional details.  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  Has resolved with omeprazole .  Hgb, mcv now up trending with iron  replacement.      Current Outpatient Medications on File Prior to Visit  Medication Sig Dispense Refill   amLODipine  (NORVASC ) 10 MG tablet Take 1 tablet (10 mg total) by mouth daily. 90 tablet 2   losartan  (COZAAR ) 50 MG tablet Take 1 tablet (50 mg total) by mouth daily. 90 tablet 3   ondansetron  (ZOFRAN -ODT) 4 MG disintegrating tablet Take 1 tablet (4 mg total) by mouth every 8 (eight) hours as needed. 12 tablet 0   potassium chloride  (KLOR-CON ) 10 MEQ tablet Take 1 tablet (10 mEq total) by mouth daily. 4 tablet 0   potassium chloride  SA (KLOR-CON  M) 20 MEQ tablet Take 1 tablet (20 mEq total) by mouth 2 (two) times daily. 10 tablet 0   rosuvastatin  (CRESTOR ) 20 MG tablet Take 1 tablet (20 mg total) by mouth daily. 90 tablet 3   No current facility-administered medications on file prior to visit.    History reviewed. No pertinent family history.  Social History   Socioeconomic History   Marital status: Widowed    Spouse name: Not on file   Number of children: Not on file   Years of education: Not on file   Highest education level: Not on file  Occupational History   Not on file  Tobacco Use   Smoking status: Never   Smokeless tobacco: Never  Vaping Use   Vaping status: Never Used  Substance and Sexual Activity    Alcohol use: Not Currently   Drug use: Never   Sexual activity: Yes  Other Topics Concern   Not on file  Social History Narrative   Not on file   Social Drivers of Health   Financial Resource Strain: Low Risk  (04/19/2024)   Overall Financial Resource Strain (CARDIA)    Difficulty of Paying Living Expenses: Not hard at all  Food Insecurity: No Food Insecurity (04/19/2024)   Hunger Vital Sign    Worried About Running Out of Food in the Last Year: Never true    Ran Out of Food in the Last Year: Never true  Transportation Needs: No Transportation Needs (04/19/2024)   PRAPARE - Administrator, Civil Service (Medical): No    Lack of Transportation (Non-Medical): No  Physical Activity: Sufficiently Active (04/19/2024)   Exercise Vital Sign    Days of Exercise per Week: 5 days    Minutes of Exercise per Session: 60 min  Stress: No Stress Concern Present (04/19/2024)   [employer] of Occupational Health - Occupational Stress Questionnaire    Feeling of Stress : Not at all  Social Connections: Socially Isolated (04/19/2024)   Social Connection and Isolation Panel [NHANES]    Frequency of Communication with Friends and Family: More than three times a week  Frequency of Social Gatherings with Friends and Family: More than three times a week    Attends Religious Services: Never    Database administrator or Organizations: No    Attends Banker Meetings: Never    Marital Status: Separated  Intimate Partner Violence: Not At Risk (04/19/2024)   Humiliation, Afraid, Rape, and Kick questionnaire    Fear of Current or Ex-Partner: No    Emotionally Abused: No    Physically Abused: No    Sexually Abused: No    Review of Systems: ROS negative except for what is noted on the assessment and plan.  Vitals:   04/26/24 0917  BP: 132/65  Pulse: 71  Temp: 97.8 F (36.6 C)  TempSrc: Oral  SpO2: 99%  Weight: 176 lb (79.8 kg)  Height: 5\' 6"  (1.676 m)    Physical  Exam: Constitutional: well-appearing in no acute distress HENT: normocephalic atraumatic, mucous membranes moist Eyes: conjunctiva non-erythematous Neck: supple Cardiovascular: regular rate and rhythm, no m/r/g Pulmonary/Chest: normal work of breathing on room air, lungs clear to auscultation bilaterally Abdominal: soft, non-tender, non-distended MSK: normal bulk and tone Neurological: alert & oriented x 3, 5/5 strength in bilateral upper and lower extremities, normal gait Skin: warm and dry  Assessment & Plan:   Palpitations Patient presents with acute concern for tachycardia occurring at night.  Of note, the patient was seen in the ED on 5/6 after she went on a walk with her daughter and subsequently felt dizzy with generalized weakness.  She also complained of a racing heart with vomiting and diarrhea.  Her BMP was notable for potassium 3.1, and her CBC was notable for an MCV of 79.2.  Of note, she also had a potassium of 2.8 on 4/10. She does have a history of iron  deficiency anemia noted on an iron  panel around 9 years ago.  The symptoms occur when she first gets up in the morning or after waking up from a nap in the recliner. She notes they happen intermittently but have more recently occurred since April. No chest pain or LOC.  These happen mainly in the morning resolved after few minutes.  Patient has had abnormal aldosterone levels in the past so we will follow-up with this today.  If her aldosterone is abnormal similar to 6 years ago, would consider substituting spironolactone with amlodipine . - Follow-up BMP - Follow-up iron  panel - Follow-up aldosterone with renin activity  Patient discussed with Dr. Bess Broody, MD  [facility] Internal Medicine, PGY-1 Date 04/26/2024 Time 11:28 AM

## 2024-04-26 NOTE — Patient Instructions (Signed)
 Thank you so much for coming to the clinic today!   I will call you back regarding your labs.  We will see you in one month.   If you have any questions please feel free to the call the clinic at anytime at [phone]. It was a pleasure seeing you!  Best, Dr. Carolee Churchman

## 2024-04-26 NOTE — Progress Notes (Signed)
 Internal Medicine Clinic Attending  Case discussed with the resident at the time of the visit.  We reviewed the resident's history and exam and pertinent patient test results.  I agree with the assessment, diagnosis, and plan of care documented in the resident's note.

## 2024-04-26 NOTE — Assessment & Plan Note (Addendum)
 Patient presents with acute concern for tachycardia occurring at night.  Of note, the patient was seen in the ED on 5/6 after she went on a walk with her daughter and subsequently felt dizzy with generalized weakness.  She also complained of a racing heart with vomiting and diarrhea.  Her BMP was notable for potassium 3.1, and her CBC was notable for an MCV of 79.2.  Of note, she also had a potassium of 2.8 on 4/10. She does have a history of iron  deficiency anemia noted on an iron  panel around 9 years ago.  The symptoms occur when she first gets up in the morning or after waking up from a nap in the recliner. She notes they happen intermittently but have more recently occurred since April. No chest pain or LOC.  These happen mainly in the morning resolved after few minutes.  Patient has had abnormal aldosterone levels in the past so we will follow-up with this today.  If her aldosterone is abnormal similar to 6 years ago, would consider substituting spironolactone with amlodipine . - Follow-up BMP - Follow-up iron  panel - Follow-up aldosterone with renin activity

## 2024-05-01 ENCOUNTER — Telehealth: Payer: Self-pay | Admitting: *Deleted

## 2024-05-01 LAB — BMP8+ANION GAP
Anion Gap: 20 mmol/L — ABNORMAL HIGH (ref 10.0–18.0)
BUN/Creatinine Ratio: 13 (ref 12–28)
BUN: 14 mg/dL (ref 8–27)
CO2: 18 mmol/L — ABNORMAL LOW (ref 20–29)
Calcium: 10 mg/dL (ref 8.7–10.3)
Chloride: 104 mmol/L (ref 96–106)
Creatinine, Ser: 1.06 mg/dL — ABNORMAL HIGH (ref 0.57–1.00)
Glucose: 89 mg/dL (ref 70–99)
Potassium: 3.9 mmol/L (ref 3.5–5.2)
Sodium: 142 mmol/L (ref 134–144)
eGFR: 56 mL/min/{1.73_m2} — ABNORMAL LOW (ref >59)

## 2024-05-01 LAB — IRON,TIBC AND FERRITIN PANEL
Ferritin: 238 ng/mL — ABNORMAL HIGH (ref 15–150)
Iron Saturation: 19 % (ref 15–55)
Iron: 65 ug/dL (ref 27–139)
Total Iron Binding Capacity: 334 ug/dL (ref 250–450)
UIBC: 269 ug/dL (ref 118–369)

## 2024-05-01 LAB — ALDOSTERONE + RENIN ACTIVITY W/ RATIO
Aldos/Renin Ratio: 15.1 (ref 0.0–30.0)
Aldosterone: 15.9 ng/dL (ref 0.0–30.0)
Renin Activity, Plasma: 1.05 ng/mL/h (ref 0.167–5.380)

## 2024-05-01 NOTE — Telephone Encounter (Signed)
 Will forward message to Dr. Carolee Churchman and the yellow team to notify patient of her results. Copied from CRM [id number]. Topic: Clinical - Lab/Test Results >> May 01, 2024  9:39 AM Blair Bumpers wrote: Reason for CRM: Patient is awaiting call on lab results. She states to her it's kind of concerning and she would like for someone to contact her. Please give her a call back at [phone].

## 2024-05-02 ENCOUNTER — Ambulatory Visit: Payer: Self-pay | Admitting: Student

## 2024-05-02 NOTE — Telephone Encounter (Signed)
 Copied from CRM [id number]. Topic: Clinical - Lab/Test Results >> May 01, 2024  9:39 AM Blair Bumpers wrote: Reason for CRM: Patient is awaiting call on lab results. She states to her it's kind of concerning and she would like for someone to contact her. Please give her a call back at [phone]. >> May 02, 2024  1:02 PM Brittney F wrote: Patient called in for the third time looking for information regarding the lab results. Patient feels the results do effect her medication she is actively taking. She has concerns regarding her elevated iron  and ferratin due to having concerns about it being damaging to her kidneys. Patient is very concerned and would like a callback in reference to her reducing her iron  levels by way of medications or advice from the clinical staff.   Patient is unclear if the lab results are a result of her recent ER visit. Patient had concerns regarding changing the dosage of her blood pressure medication. Please call the patient back in a timely fashion to assist with going over her lab results.   Callback Number: [phone]

## 2024-05-02 NOTE — Telephone Encounter (Signed)
 Patient is calling again trying to get an update on her lab results.  Forwarding message to the triage nurse to try an escalate request since it has been over 24 hours.

## 2024-05-02 NOTE — Progress Notes (Signed)
 Sent patient MyChart message noting stable labs.

## 2024-05-04 ENCOUNTER — Encounter: Admitting: Family

## 2024-05-04 NOTE — Progress Notes (Signed)
 Called patient to discuss lab results in addition to sending MyChart message. K+ normal. Aldosterone + renin normal. Ferritin elevated but overall unremarkable. Patient understanding of the results.

## 2024-05-05 ENCOUNTER — Ambulatory Visit: Admitting: Family

## 2024-05-05 ENCOUNTER — Encounter: Payer: Self-pay | Admitting: Family

## 2024-05-05 VITALS — BP 154/98 | HR 75 | Temp 98.3°F | Ht 66.54 in | Wt 176.4 lb

## 2024-05-05 DIAGNOSIS — R300 Dysuria: Secondary | ICD-10-CM

## 2024-05-05 DIAGNOSIS — R7303 Prediabetes: Secondary | ICD-10-CM

## 2024-05-05 DIAGNOSIS — E785 Hyperlipidemia, unspecified: Secondary | ICD-10-CM | POA: Diagnosis not present

## 2024-05-05 DIAGNOSIS — N1831 Chronic kidney disease, stage 3a: Secondary | ICD-10-CM

## 2024-05-05 DIAGNOSIS — Z7689 Persons encountering health services in other specified circumstances: Secondary | ICD-10-CM

## 2024-05-05 DIAGNOSIS — R002 Palpitations: Secondary | ICD-10-CM | POA: Diagnosis not present

## 2024-05-05 DIAGNOSIS — I10 Essential (primary) hypertension: Secondary | ICD-10-CM

## 2024-05-05 DIAGNOSIS — R413 Other amnesia: Secondary | ICD-10-CM

## 2024-05-05 LAB — POCT URINALYSIS DIPSTICK
Blood, UA: NEGATIVE
Glucose, UA: NEGATIVE
Nitrite, UA: NEGATIVE
Protein, UA: NEGATIVE
Spec Grav, UA: <=1.005 — AB (ref 1.010–1.025)
Urobilinogen, UA: 0.2 U/dL
pH, UA: 7 (ref 5.0–8.0)

## 2024-05-05 LAB — TSH: TSH: 1.8 m[IU]/L (ref 0.40–4.50)

## 2024-05-05 NOTE — Patient Instructions (Signed)

## 2024-05-06 LAB — URINE CULTURE
MICRO NUMBER:: 16549868
SPECIMEN QUALITY:: ADEQUATE

## 2024-05-07 NOTE — Progress Notes (Signed)
 Provider: Christean Courts FNP-C   Masters, Alston Jerry, DO  Patient Care Team: Karalee Oscar, DO as PCP - General  Extended Emergency Contact Information Primary Emergency Contact: Sellars,Elfreda Mobile Phone: [phone] Relation: Daughter Secondary Emergency Contact: Smith,Gwendolyn Mobile Phone: [phone] Relation: Relative Preferred language: English Interpreter needed? No  Code Status:  Full Code  Goals of care: Advanced Directive information    04/26/2024    9:20 AM  Advanced Directives  Does Patient Have a Medical Advance Directive? No  Would patient like information on creating a medical advance directive? No - Patient declined     Chief Complaint  Patient presents with   New Patient (Initial Visit)    New patient -establish care , discuss K+ level were running low she when to ED for this , she had labs done on 04/26/24 she said that she wasn't contacted regarding the results from that provider, she said that she looked at the results on results on her my chart acct,she notice that iron . Having nausea , and heart racing that she said  waking up. Thinks she might have yeast infection ,     Discussed the use of AI scribe software for clinical note transcription with the patient, who gave verbal consent to proceed.  History of Present Illness   Kayla Williams is a 73 year old female with hypertension and chronic kidney disease who presents to establish care.  She has a history of hypertension, managed with amlodipine  10 mg and losartan  50 mg. Her blood pressure was well-controlled at her last visit to the internal medicine center. She also takes rosuvastatin  20 mg for cholesterol management.  She has chronic kidney disease, stage three, which she discovered in her medical records. Her kidney function was recently rechecked.  She has experienced low potassium levels, leading to two emergency room visits in the past month and a half. Her potassium levels have  fluctuated, with recent levels being normal at 3.9.  She is prediabetic with a slightly elevated hemoglobin A1c. She has been trying to manage her weight and reports some success in weight loss. She engages in walking as a form of exercise but has been less active recently due to stress.  She experiences occasional dizziness and heart palpitations, particularly when lying down, which she associates with previous low potassium episodes. No chest pain, but she reports palpitations and a sensation of her heart 'vibrating'.  She has a family history of dementia, high blood pressure, and diabetes. Her father had dementia, and her mother had high blood pressure and possibly diabetes. All her siblings have high blood pressure, and some have diabetes.  She lives alone, is retired, and has never smoked or consumed alcohol. She enjoys walking but has been less active recently. She drinks a lot of water and has cut down on sugary drinks.  She has a partial amputation on her right foot due to cancer, which occurred about 20 years ago. She manages well with this condition and does not report any current issues related to it.  She reports some memory issues, such as forgetting names and places, which she attributes to her age and family history of dementia. She lives independently and manages her daily activities, including cooking and managing finances.  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  Has resolved with omeprazole .  Hgb, mcv now up trending with iron  replacement.     Past Surgical History:  Procedure Laterality Date   FOOT AMPUTATION Right 1994   "for cancer"     Allergies  Allergen Reactions   Lisinopril  Cough    Allergies as of 05/05/2024       Reactions   Lisinopril  Cough        Medication List        Accurate as of May 05, 2024 11:59 PM. If you have any questions, ask your nurse or doctor.          STOP taking these medications    ondansetron  4 MG disintegrating tablet Commonly known as: ZOFRAN -ODT Stopped by: Elery Cadenhead C Alara Daniel   potassium chloride  10 MEQ tablet Commonly known as: KLOR-CON  Stopped by: Tangala Wiegert C Edrei Norgaard   potassium chloride  SA 20 MEQ tablet Commonly known as: KLOR-CON  M Stopped by: Mandie Crabbe C Makaveli Hoard       TAKE these medications    amLODipine  10 MG tablet Commonly known as: NORVASC  Take 1 tablet (10 mg total) by mouth daily.   losartan  50 MG tablet Commonly known as: Cozaar  Take 1 tablet (50 mg total) by mouth daily.   rosuvastatin  20 MG tablet Commonly known as: Crestor  Take 1 tablet (20 mg total) by mouth daily.        Review of Systems  Constitutional:  Negative for appetite change, chills, fatigue, fever and unexpected weight change.  HENT:  Negative for congestion, dental problem, ear discharge, ear pain, facial swelling, hearing loss, nosebleeds, postnasal drip, rhinorrhea, sinus pressure, sinus pain, sneezing, sore throat, tinnitus and trouble swallowing.   Eyes:  Negative for pain, discharge, redness, itching and visual disturbance.  Respiratory:  Negative for cough, chest tightness, shortness of breath and wheezing.   Cardiovascular:  Positive for palpitations. Negative for chest pain and leg swelling.  Gastrointestinal:  Negative for abdominal distention, abdominal pain, blood in stool, constipation, diarrhea, nausea and vomiting.  Endocrine: Negative for cold intolerance, heat intolerance, polydipsia, polyphagia and polyuria.  Genitourinary:  Negative for difficulty urinating, dysuria, flank pain, frequency and urgency.  Musculoskeletal:  Negative for arthralgias, back pain, gait  problem, joint swelling, myalgias, neck pain and neck stiffness.       Hx of right foot amputee  Skin:  Negative for color change, pallor, rash and wound.  Neurological:  Positive for dizziness. Negative for syncope, speech difficulty, weakness, light-headedness, numbness and headaches.  Hematological:  Does not bruise/bleed easily.  Psychiatric/Behavioral:  Negative for agitation, behavioral problems, confusion, hallucinations, self-injury, sleep disturbance and suicidal ideas. The patient is not nervous/anxious.     Immunization History  Administered Date(s) Administered   Influenza,inj,Quad PF,6+ Mos 07/25/2018   PFIZER(Purple Top)SARS-COV-2 Vaccination 01/28/2020, 02/19/2020   Pneumococcal Conjugate-13 06/27/2018   Pneumococcal Polysaccharide-23 04/09/2021   Tdap 06/27/2018   Pertinent  Health Maintenance Due  Topic Date Due   INFLUENZA VACCINE  06/30/2024   COLON CANCER SCREENING ANNUAL FOBT  03/05/2025   MAMMOGRAM  04/12/2026   Colonoscopy  05/17/2028   DEXA SCAN  Completed      02/01/2023    2:28 PM 03/04/2023    9:51 AM 04/19/2024   11:16 AM 04/26/2024    9:19 AM 05/05/2024    9:16 AM  Fall Risk  Falls in the past year? 0 0 0 0 0  Was there an injury with Fall? 0 0 0 0 0  Fall Risk Category Calculator 0 0 0 0 0  Patient at Risk for Falls Due to History of fall(s);Impaired balance/gait;No Fall Risks History of fall(s) No Fall Risks No Fall Risks No Fall Risks  Fall risk Follow up Falls evaluation completed;Falls prevention discussed Falls evaluation completed Falls evaluation completed Falls evaluation completed;Falls prevention discussed Falls evaluation completed   Functional Status Survey:    Vitals:   05/05/24 0914  BP: (!) 154/98  Pulse: 75  Temp: 98.3 F (36.8 C)  SpO2: 98%  Weight: 176 lb 6.4 oz (80 kg)  Height: 5' 6.54" (1.69 m)   Body mass index is 28.02 kg/m. Physical Exam  VITALS: T- 98.3, P- 75, BP- 154/98, SaO2- 94% MEASUREMENTS: Weight-  176.4. GENERAL: Alert, cooperative, well developed, well nourished, no acute distress. HEENT: Normocephalic, atraumatic, pupils equal, round, and reactive to light, normal oropharynx, moist mucous membranes, pharynx normal without exudate or inflammation. NECK: Supple, no nuchal rigidity, non-tender. CHEST: Clear to auscultation bilaterally, no wheezes, rhonchi, or crackles. CARDIOVASCULAR: Regular rate and rhythm, S1 and S2 normal without murmurs. ABDOMEN: Soft, non-tender, non-distended, without organomegaly, normal bowel sounds, no masses. EXTREMITIES: No clubbing, cyanosis, or edema. MUSCULOSKELETAL: Normal range of motion, non-tender, 5/5 strength in hips, knees, and shoulders. NEUROLOGICAL: Cranial nerves II-XII grossly intact, awake and alert, moves all extremities without gross motor or sensory deficit.  SKIN: No rash,no lesion or erythema   PSYCHIATRY/BEHAVIORAL: Mood stable   Labs reviewed: Recent Labs    03/14/24 0921 04/04/24 0123 04/26/24 1202  NA 140 138 142  K 4.8 3.1* 3.9  CL 102 102 104  CO2 22 22 18*  GLUCOSE 98 187* 89  BUN 18 14 14   CREATININE 1.36* 0.87 1.06*  CALCIUM  10.1 9.8 10.0   Recent Labs    03/09/24 2047  AST 25  ALT 17  ALKPHOS 43  BILITOT 0.5  PROT 7.5  ALBUMIN 4.3   Recent Labs    03/09/24 2047 04/04/24 0123  WBC 5.8 6.7  HGB 13.2 13.0  HCT 37.8 37.3  MCV 80.1 79.2*  PLT 293 283   Lab Results  Component Value Date   TSH 1.80 05/05/2024   Lab Results  Component Value Date   HGBA1C 6.0 (H) 03/14/2024   Lab Results  Component Value Date   CHOL 134 03/14/2024   HDL 49 03/14/2024   LDLCALC 73 03/14/2024   TRIG 56 03/14/2024   CHOLHDL 2.7 03/14/2024    Significant Diagnostic Results in last 30 days:  MM 3D SCREENING MAMMOGRAM BILATERAL BREAST Result Date: 04/14/2024 CLINICAL DATA:  Screening. EXAM: DIGITAL SCREENING BILATERAL MAMMOGRAM WITH TOMOSYNTHESIS AND CAD TECHNIQUE: Bilateral screening digital craniocaudal and  mediolateral oblique mammograms were obtained. Bilateral screening digital breast tomosynthesis was performed. The images were evaluated with computer-aided detection. COMPARISON:  Previous exam(s). ACR Breast Density Category b: There are scattered areas of fibroglandular density. FINDINGS: There are no findings suspicious for malignancy. IMPRESSION: No mammographic evidence of malignancy. A result letter of this screening mammogram will be mailed directly to the patient. RECOMMENDATION: Screening mammogram in one year. (Code:SM-B-01Y) BI-RADS CATEGORY  1: Negative. Electronically Signed   By: Amanda Jungling M.D.   On: 04/14/2024 12:34    Assessment/Plan  Hypertension Long-standing hypertension managed with amlodipine  10 mg and losartan  50 mg. Blood pressure elevated at 154/98, likely due to anxiety or stress. Emphasis on maintaining  blood pressure control to prevent progression of chronic kidney disease. Discussed lifestyle modifications, including diet and exercise, to support management. - Continue amlodipine  10 mg daily - Continue losartan  50 mg daily - Encourage regular blood pressure monitoring - Promote lifestyle modifications including diet and exercise  Chronic Kidney Disease, Stage 3 Chronic kidney disease stage 3 with stable kidney function and slight improvement. Emphasis on controlling blood pressure and avoiding diabetes to prevent further damage. Discussed dietary modifications, including reducing salt and fat intake, to support kidney health. - Monitor kidney function regularly - Encourage blood pressure control - Advise on dietary modifications to support kidney health  Prediabetes Hemoglobin A1c indicates prediabetes. Emphasis on lifestyle modifications to prevent progression to diabetes, which could exacerbate kidney disease. Discussed importance of weight loss, diet, and exercise in managing blood glucose levels. - Encourage weight loss through diet and exercise - Monitor  blood glucose levels regularly  Hyperlipidemia Managed with rosuvastatin  20 mg. Emphasis on dietary modifications to support cholesterol management and cardiovascular health. Discussed risks of high cholesterol and benefits of a diet low in saturated fats and high in plant-based foods. - Continue rosuvastatin  20 mg daily - Advise on dietary modifications to reduce cholesterol  Potassium Imbalance Fluctuating potassium levels, currently at 3.9. Previous episodes of hypokalemia causing symptoms. Emphasis on dietary intake of potassium-rich foods. Discussed importance of monitoring potassium levels and dietary sources of potassium. - Monitor potassium levels regularly - Advise on dietary intake of potassium-rich foods  Urinary Tract Infection (UTI) Trace white blood cells in urine, no nitrates or blood. Symptoms suggestive of possible UTI. Urine culture ordered to confirm diagnosis and guide treatment. Discussed process of urine culture and its role in identifying bacterial infections. - Send urine for culture - Advise to report worsening symptoms  Palpitations Intermittent palpitations, possibly related to anxiety or thyroid  dysfunction. EKG shows normal sinus rhythm. Thyroid  function to be evaluated. Discussed role of stress management and potential impact of thyroid  dysfunction on palpitations. 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 Family history of dementia. Emphasis on cognitive activities and lifestyle modifications to support cognitive health. Discussed benefits of cognitive exercises and a healthy diet in maintaining brain function. - Encourage cognitive activit ies such as reading and puzzles - Advise on dietary modifications to support brain health  General Health Maintenance Up to date on flu, tetanus, and pneumonia vaccines. Due for shingles and COVID vaccines. Recent  Cologuard test completed. Discussed importance of receiving shingles and COVID vaccines to prevent potential complications. - Advise to receive shingles vaccine - Advise to receive COVID vaccine  Follow-up Emphasized importance of regular follow-up to monitor health status and adjust treatment plans as necessary. - Schedule follow-up appointment in six months - Order lab work prior to next appointment   Family/ staff Communication: Reviewed plan of care with patient verbalized understanding   Labs/tests ordered:  - Urine Culture; Future - POC Urinalysis Dipstick - CBC with Differential/Platelet - CMP with eGFR([facility]) - TSH - Hgb A1C - Lipid panel - EKG   Next Appointment : Return in about 6 months (around 11/04/2024) for medical mangement of chronic issues., Fasting labs in 6 months prior to visit.   Spent 45 minutes of Face to face and non-face to face with patient  >50% time spent counseling; reviewing medical record; tests; labs; documentation and developing future plan of care.   Estil Heman, NP

## 2024-05-07 NOTE — Progress Notes (Signed)
   This encounter was created in error - please disregard. No show

## 2024-05-10 ENCOUNTER — Ambulatory Visit: Payer: Self-pay | Admitting: Student

## 2024-05-11 ENCOUNTER — Ambulatory Visit: Payer: Self-pay | Admitting: Family

## 2024-05-16 ENCOUNTER — Ambulatory Visit: Payer: Self-pay | Admitting: *Deleted

## 2024-05-16 ENCOUNTER — Ambulatory Visit: Payer: Self-pay | Admitting: Student

## 2024-05-16 ENCOUNTER — Ambulatory Visit (INDEPENDENT_AMBULATORY_CARE_PROVIDER_SITE_OTHER): Admitting: Sports Medicine

## 2024-05-16 VITALS — BP 140/86 | HR 81 | Temp 98.9°F | Resp 12 | Ht 66.54 in | Wt 173.8 lb

## 2024-05-16 DIAGNOSIS — R002 Palpitations: Secondary | ICD-10-CM

## 2024-05-16 DIAGNOSIS — N1831 Chronic kidney disease, stage 3a: Secondary | ICD-10-CM

## 2024-05-16 DIAGNOSIS — F411 Generalized anxiety disorder: Secondary | ICD-10-CM | POA: Diagnosis not present

## 2024-05-16 DIAGNOSIS — E785 Hyperlipidemia, unspecified: Secondary | ICD-10-CM

## 2024-05-16 DIAGNOSIS — I10 Essential (primary) hypertension: Secondary | ICD-10-CM

## 2024-05-16 DIAGNOSIS — R7303 Prediabetes: Secondary | ICD-10-CM

## 2024-05-16 MED ORDER — SERTRALINE HCL 25 MG PO TABS: 25.0000 mg | ORAL_TABLET | Freq: Every day | ORAL | 3 refills | Status: DC

## 2024-05-16 MED ORDER — METOPROLOL SUCCINATE ER 50 MG PO TB24: 50.0000 mg | ORAL_TABLET | Freq: Every day | ORAL | 3 refills | Status: AC

## 2024-05-16 NOTE — Progress Notes (Signed)
 Careteam: Patient Care Team: Ngetich, Dinah C, NP as PCP - General (Family Medicine)  PLACE OF SERVICE:  [facility]  Advanced Directive information    Allergies  Allergen Reactions   Lisinopril  Cough    Chief Complaint  Patient presents with   Palpitations    Patient complains of having palpitations throughout the night. ( Pt usually can get it to stop but it has been more on than off she stated she usually takes K for it.  Headache      Discussed the use of AI scribe software for clinical note transcription with the patient, who gave verbal consent to proceed.  History of Present Illness    Kayla Williams is a 73 year old female who presents with recurrent episodes of heart palpitations.  She has been experiencing heart palpitations for the past four months, with episodes occurring three times in the last few months, prompting visits to the emergency room. These episodes last about ten minutes and are associated with nausea and occasional lightheadedness, but not with chest pain or shortness of breath. She manages the palpitations by taking potassium supplements, initially prescribed after low potassium levels were identified during an emergency room visit. She also uses electrolyte mixtures to preemptively manage symptoms.  Recently, her palpitations have become more frequent and severe, occurring throughout the day and night, disrupting her sleep. She has not been able to engage in her usual walking routine due to these episodes. She has not seen a cardiologist recently, although she did many years ago, and she had an echocardiogram in 2019. She has worn a Holter monitor in the past, but not recently.  Her current medications include amlodipine  10 mg, losartan  50 mg, and Crestor . She notes that her blood pressure medications were changed some months ago, and she is unsure why, as her previous regimen was effective.  She reports a sensation of her bladder feeling 'funny'  during these episodes, although she denies any burning with urination, increased frequency, or blood in the urine.   In terms of her social history, she used to be physically active but has reduced her activity due to the palpitations. She does not report significant anxiety or worry, although she acknowledges having a lot of responsibilities. No significant changes in appetite or gastrointestinal symptoms.  Review of Systems:  Review of Systems  Constitutional:  Negative for chills and fever.  HENT:  Negative for congestion and sore throat.   Eyes:  Negative for double vision.  Respiratory:  Negative for cough, sputum production and shortness of breath.   Cardiovascular:  Positive for palpitations. Negative for chest pain and leg swelling.  Gastrointestinal:  Negative for abdominal pain, heartburn and nausea.  Genitourinary:  Negative for dysuria, frequency and hematuria.  Musculoskeletal:  Negative for falls and myalgias.   Negative unless indicated in HPI.   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  Has resolved with omeprazole .  Hgb, mcv now up trending with iron  replacement.     Past Surgical History:  Procedure Laterality Date   FOOT AMPUTATION Right 1994   for  cancer   Social History:   reports that she has never smoked. She has never used smokeless tobacco. She reports that she does not currently use alcohol. She reports that she does not use drugs.  Family History  Problem Relation Age of Onset   Diabetes Mother    Hypertension Mother    Dementia Father    Hypertension Sister     Medications: Patient's Medications  New Prescriptions    No medications on file  Previous Medications   AMLODIPINE  (NORVASC ) 10 MG TABLET    Take 1 tablet (10 mg total) by mouth daily.   LOSARTAN  (COZAAR ) 50 MG TABLET    Take 1 tablet (50 mg total) by mouth daily.   ROSUVASTATIN  (CRESTOR ) 20 MG TABLET    Take 1 tablet (20 mg total) by mouth daily.  Modified Medications   No medications on file  Discontinued Medications   No medications on file    Physical Exam: Vitals:   05/16/24 1454  BP: (!) 132/112  Pulse: 81  Resp: 12  Temp: 98.9 F (37.2 C)  TempSrc: Temporal  SpO2: 96%  Weight: 173 lb 12.8 oz (78.8 kg)  Height: 5' 6.54 (1.69 m)   Body mass index is 27.6 kg/m. BP Readings from Last 3 Encounters:  05/16/24 (!) 132/112  05/05/24 (!) 154/98  04/26/24 132/65   Wt Readings from Last 3 Encounters:  05/16/24 173 lb 12.8 oz (78.8 kg)  05/05/24 176 lb 6.4 oz (80 kg)  04/26/24 176 lb (79.8 kg)    Physical Exam Constitutional:      Appearance: Normal appearance.  HENT:     Head: Normocephalic and atraumatic.   Cardiovascular:     Rate and Rhythm: Normal rate and regular rhythm.  Pulmonary:     Effort: Pulmonary effort is normal. No respiratory distress.     Breath sounds: Normal breath sounds. No wheezing.  Abdominal:     General: Bowel sounds are normal. There is no distension.     Tenderness: There is no abdominal tenderness. There is no guarding or rebound.     Comments:     Musculoskeletal:        General: No swelling.   Neurological:     Mental Status: She is alert. Mental status is at baseline.     Motor: No weakness.     Labs reviewed: Basic Metabolic Panel: Recent Labs    03/14/24 0921 04/04/24 0123 04/26/24 1202 05/05/24 1106  NA 140 138 142  --   K 4.8 3.1* 3.9  --   CL 102 102 104  --   CO2 22 22 18*  --   GLUCOSE 98 187* 89  --   BUN 18 14 14   --   CREATININE 1.36* 0.87 1.06*  --   CALCIUM  10.1 9.8 10.0  --   TSH  --   --   --  1.80   Liver Function Tests: Recent Labs     03/09/24 2047  AST 25  ALT 17  ALKPHOS 43  BILITOT 0.5  PROT 7.5  ALBUMIN 4.3   No results for input(s): LIPASE, AMYLASE in the last 8760 hours. No results for input(s): AMMONIA in the last 8760 hours. CBC: Recent Labs    03/09/24 2047 04/04/24 0123  WBC 5.8 6.7  HGB 13.2 13.0  HCT 37.8 37.3  MCV 80.1 79.2*  PLT 293 283   Lipid Panel: Recent Labs    03/14/24 0921  CHOL 134  HDL 49  LDLCALC  73  TRIG 56  CHOLHDL 2.7   TSH: Recent Labs    05/05/24 1106  TSH 1.80   A1C: Lab Results  Component Value Date   HGBA1C 6.0 (H) 03/14/2024    Assessment and Plan Assessment & Plan  1. Palpitations (Primary)   - EKG 12-Lead - Ambulatory referral to Cardiology - metoprolol succinate (TOPROL-XL) 50 MG 24 hr tablet; Take 1 tablet (50 mg total) by mouth daily. Take with or immediately following a meal.  Dispense: 90 tablet; Refill: 3 - CBC with Differential/Platelet  2. GAD (generalized anxiety disorder)  Gad -4  Will start zoloft  - sertraline (ZOLOFT) 25 MG tablet; Take 1 tablet (25 mg total) by mouth daily.  Dispense: 30 tablet; Refill: 3  3. Prediabetes   - Hemoglobin A1c  4. Essential hypertension  [facility] witch amlodipine  to metoprolol due to palpitations Monitor bp Follow up in 4 weeks - CBC with Differential/Platelet - COMPLETE METABOLIC PANEL WITHOUT GFR - TSH  5. Stage 3a chronic kidney disease (HCC)   - COMPLETE METABOLIC PANEL WITHOUT GFR  6. Hyperlipidemia LDL goal <100   - Lipid panel

## 2024-05-16 NOTE — Telephone Encounter (Signed)
 FYI Only or Action Required?: Action required by provider  Patient was last seen in primary care on 05/05/2024 by Ngetich, Elijio Guadeloupe, NP. Called Nurse Triage reporting Palpitations. Symptoms began several days ago. Interventions attempted: Rest, hydration, or home remedies. Symptoms are: gradually worsening.  Triage Disposition: See HCP Within 4 Hours (Or PCP Triage)  Patient/caregiver understands and will follow disposition?: Yes                   Copied from CRM [id number]. Topic: Clinical - Red Word Triage >> May 16, 2024  8:59 AM Blair Bumpers wrote: Red Word that prompted transfer to Nurse Triage: Patient states she has been having more intense heart palpitations since last night. States she really wasn't able to sleep because of them. Wants to see if she can see Dr. Nedra Ball or get medication from her. Reason for Disposition  Age > 60 years  (Exception: Brief heartbeat symptoms that went away and now feels well.)  Answer Assessment - Initial Assessment Questions 1. DESCRIPTION: Please describe your heart rate or heartbeat that you are having (e.g., fast/slow, regular/irregular, skipped or extra beats, palpitations)     palpitations 2. ONSET: When did it start? (Minutes, hours or days)      Few days ago but worse last night  3. DURATION: How long does it last (e.g., seconds, minutes, hours)     Lasted throughout night 4. PATTERN Does it come and go, or has it been constant since it started?  Does it get worse with exertion?   Are you feeling it now?     Comes and goes  5. TAP: Using your hand, can you tap out what you are feeling on a chair or table in front of you, so that I can hear? (Note: not all patients can do this)       na 6. HEART RATE: Can you tell me your heart rate? How many beats in 15 seconds?  (Note: not all patients can do this)       Na  7. RECURRENT SYMPTOM: Have you ever had this before? If Yes, ask: When was the last time? and What  happened that time?      Yes but got better  8. CAUSE: What do you think is causing the palpitations?     Not sure  9. CARDIAC HISTORY: Do you have any history of heart disease? (e.g., heart attack, angina, bypass surgery, angioplasty, arrhythmia)      No but palpitations 12-15 years ago  10. OTHER SYMPTOMS: Do you have any other symptoms? (e.g., dizziness, chest pain, sweating, difficulty breathing)       Quick intense beating of heart at times but palpitations throughout the night . Has been taking potassium. Nausea but gone now.  11. PREGNANCY: Is there any chance you are pregnant? When was your last menstrual period?       Na  No available appt with PCP. Called CAL to assist with appt. Scheduled appt today with other provider. Recommended if sx worsen go to ED.  Protocols used: Heart Rate and Heartbeat Questions-A-AH

## 2024-05-17 LAB — COMPLETE METABOLIC PANEL WITHOUT GFR
AG Ratio: 1.7 (calc) (ref 1.0–2.5)
ALT: 20 U/L (ref 6–29)
AST: 23 U/L (ref 10–35)
Albumin: 4.9 g/dL (ref 3.6–5.1)
Alkaline phosphatase (APISO): 50 U/L (ref 37–153)
BUN/Creatinine Ratio: 13 (calc) (ref 6–22)
BUN: 15 mg/dL (ref 7–25)
CO2: 26 mmol/L (ref 20–32)
Calcium: 10.3 mg/dL (ref 8.6–10.4)
Chloride: 103 mmol/L (ref 98–110)
Creat: 1.12 mg/dL — ABNORMAL HIGH (ref 0.60–1.00)
Globulin: 2.9 g/dL (ref 1.9–3.7)
Glucose, Bld: 105 mg/dL (ref 65–139)
Potassium: 4.4 mmol/L (ref 3.5–5.3)
Sodium: 139 mmol/L (ref 135–146)
Total Bilirubin: 0.6 mg/dL (ref 0.2–1.2)
Total Protein: 7.8 g/dL (ref 6.1–8.1)

## 2024-05-17 LAB — CBC WITH DIFFERENTIAL/PLATELET
Absolute Lymphocytes: 1172 {cells}/uL (ref 850–3900)
Absolute Monocytes: 392 {cells}/uL (ref 200–950)
Basophils Absolute: 40 {cells}/uL (ref 0–200)
Basophils Relative: 1 %
Eosinophils Absolute: 12 {cells}/uL — ABNORMAL LOW (ref 15–500)
Eosinophils Relative: 0.3 %
HCT: 41.7 % (ref 35.0–45.0)
Hemoglobin: 13.6 g/dL (ref 11.7–15.5)
MCH: 28.1 pg (ref 27.0–33.0)
MCHC: 32.6 g/dL (ref 32.0–36.0)
MCV: 86.2 fL (ref 80.0–100.0)
MPV: 10.3 fL (ref 7.5–12.5)
Monocytes Relative: 9.8 %
Neutro Abs: 2384 {cells}/uL (ref 1500–7800)
Neutrophils Relative %: 59.6 %
Platelets: 219 10*3/uL (ref 140–400)
RBC: 4.84 10*6/uL (ref 3.80–5.10)
RDW: 14.2 % (ref 11.0–15.0)
Total Lymphocyte: 29.3 %
WBC: 4 10*3/uL (ref 3.8–10.8)

## 2024-05-17 LAB — TSH: TSH: 1.92 m[IU]/L (ref 0.40–4.50)

## 2024-05-17 LAB — LIPID PANEL
Cholesterol: 157 mg/dL (ref <200)
HDL: 62 mg/dL (ref >=50)
LDL Cholesterol (Calc): 80 mg/dL
Non-HDL Cholesterol (Calc): 95 mg/dL (ref <130)
Total CHOL/HDL Ratio: 2.5 (calc) (ref <5.0)
Triglycerides: 69 mg/dL (ref <150)

## 2024-05-17 LAB — HEMOGLOBIN A1C
Hgb A1c MFr Bld: 5.8 % — ABNORMAL HIGH (ref <5.7)
Mean Plasma Glucose: 120 mg/dL
eAG (mmol/L): 6.6 mmol/L

## 2024-05-25 ENCOUNTER — Ambulatory Visit: Payer: Self-pay

## 2024-05-25 NOTE — Telephone Encounter (Signed)
 FYI Only or Action Required?: FYI only for provider.  Patient was last seen in primary care on 05/16/2024 by Sherlynn Madden, MD. Called Nurse Triage reporting Palpitations. Symptoms began several months ago. Interventions attempted: Prescription medications: metoprolol and sertraline. Symptoms are: gradually improving.  Triage Disposition: See Physician Within 24 Hours (overriding See HCP Within 4 Hours (Or PCP Triage))  Patient/caregiver understands and will follow disposition?: Yes                             Copied from CRM [id number]. Topic: Clinical - Red Word Triage >> May 25, 2024  4:20 PM Alfonso ORN wrote: Red Word that prompted transfer to Nurse Triage:  patient seen veludandi, prashanthi and her  blood pressure medication was changed  the last few days having  diarrhea , patient notice when fall asleep your heart start racing ,  Patient believed the new blood pressure medication causes diarrhea , arms have a warm sensation  Patient call back number [phone] Reason for Disposition  Age > 60 years  (Exception: Brief heartbeat symptoms that went away and now feels well.)  Answer Assessment - Initial Assessment Questions 1. DESCRIPTION: Please describe your heart rate or heartbeat that you are having (e.g., fast/slow, regular/irregular, skipped or extra beats, palpitations)     States heart palpitations have improved since OV on 05/18/24, but are still present  2. ONSET: When did it start? (Minutes, hours or days)      Ongoing for months, improving, but now happening at night and when trying to nap  3. DURATION: How long does it last (e.g., seconds, minutes, hours)     It varies 4. PATTERN Does it come and go, or has it been constant since it started?  Does it get worse with exertion?   Are you feeling it now?     States she notices more palpitations/heart racing at night  8. CAUSE: What do you think is causing the palpitations?     Low  potassium  9. CARDIAC HISTORY: Do you have any history of heart disease? (e.g., heart attack, angina, bypass surgery, angioplasty, arrhythmia)      Yes 10. OTHER SYMPTOMS: Do you have any other symptoms? (e.g., dizziness, chest pain, sweating, difficulty breathing)       States bilateral arms feel warm, denies weakness/loss of sensation, denies dizziness, denies chest pain     Recently changed medications to metoprolol and sertraline  Protocols used: Heart Rate and Heartbeat Questions-A-AH

## 2024-05-26 ENCOUNTER — Ambulatory Visit: Admitting: Nurse Practitioner

## 2024-05-26 NOTE — Telephone Encounter (Signed)
Message routed to Jessica Eubanks, NP 

## 2024-05-26 NOTE — Telephone Encounter (Signed)
 Noted thank you

## 2024-06-12 NOTE — Progress Notes (Unsigned)
 Location:  [facility]  POS: Clinic  Provider: Tawni America, ANP  Code Status: *** Goals of Care:     04/26/2024    9:20 AM  Advanced Directives  Does Patient Have a Medical Advance Directive? No  Would patient like information on creating a medical advance directive? No - Patient declined     No chief complaint on file.   HPI: Discussed the use of AI scribe software for clinical note transcription with the patient, who gave verbal consent to proceed.  History of Present Illness          Saw Dr V and changed from norvasc  to metoprolol  due to palpitations. 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  Has resolved with omeprazole .  Hgb, mcv now up trending with iron  replacement.      Past Surgical History:  Procedure Laterality Date   FOOT AMPUTATION Right 1994   for cancer    Allergies  Allergen Reactions   Lisinopril  Cough    Outpatient Encounter Medications as of 06/13/2024  Medication Sig   losartan  (COZAAR ) 50 MG tablet Take 1 tablet (50 mg total) by mouth daily.   metoprolol  succinate (TOPROL -XL) 50 MG 24 hr tablet Take 1 tablet (50 mg total) by mouth daily. Take with or immediately following a meal.   rosuvastatin  (CRESTOR ) 20 MG tablet Take 1 tablet (20 mg total) by mouth daily.   sertraline  (ZOLOFT ) 25 MG tablet Take 1 tablet (25 mg total) by mouth daily.   No facility-administered encounter medications on file as of 06/13/2024.    Review of Systems:  Review of Systems  Health Maintenance  Topic Date Due    COVID-19 Vaccine (3 - Pfizer risk series) 03/18/2020   Zoster Vaccines- Shingrix (1 of 2) 08/05/2024 (Originally 10/08/1970)   INFLUENZA VACCINE  06/30/2024   COLON CANCER SCREENING ANNUAL FOBT  03/05/2025   Medicare Annual Wellness (AWV)  04/19/2025   MAMMOGRAM  04/12/2026   Colonoscopy  05/17/2028   DTaP/Tdap/Td (2 - Td or Tdap) 06/27/2028   Pneumococcal Vaccine: 50+ Years  Completed   DEXA SCAN  Completed   Hepatitis C Screening  Completed   Hepatitis B Vaccines  Aged Out   HPV VACCINES  Aged Out   Meningococcal B Vaccine  Aged Out    Physical Exam: There were no vitals filed for this visit. There is no height or weight on file to calculate BMI. Physical Exam  Labs reviewed: Basic Metabolic Panel: Recent Labs    04/04/24 0123 04/26/24 1202 05/05/24 1106 05/16/24 1546  NA 138 142  --  139  K 3.1* 3.9  --  4.4  CL 102 104  --  103  CO2 22 18*  --  26  GLUCOSE 187* 89  --  105  BUN 14 14  --  15  CREATININE 0.87 1.06*  --  1.12*  CALCIUM  9.8 10.0  --  10.3  TSH  --   --  1.80 1.92  Liver Function Tests: Recent Labs    03/09/24 2047 05/16/24 1546  AST 25 23  ALT 17 20  ALKPHOS 43  --   BILITOT 0.5 0.6  PROT 7.5 7.8  ALBUMIN 4.3  --    No results for input(s): LIPASE, AMYLASE in the last 8760 hours. No results for input(s): AMMONIA in the last 8760 hours. CBC: Recent Labs    03/09/24 2047 04/04/24 0123 05/16/24 1546  WBC 5.8 6.7 4.0  NEUTROABS  --   --  2,384  HGB 13.2 13.0 13.6  HCT 37.8 37.3 41.7  MCV 80.1 79.2* 86.2  PLT 293 283 219   Lipid Panel: Recent Labs    03/14/24 0921 05/16/24 1546  CHOL 134 157  HDL 49 62  LDLCALC 73 80  TRIG 56 69  CHOLHDL 2.7 2.5   Lab Results  Component Value Date   HGBA1C 5.8 (H) 05/16/2024    Procedures since last visit: No results found.  Assessment/Plan Assessment and Plan               Labs/tests ordered:  * No order type specified * Next appt:  11/01/2024   Total time **:  time  greater than 50% of total time spent doing pt counseling and coordination of care

## 2024-06-13 ENCOUNTER — Ambulatory Visit: Admitting: Adult Health

## 2024-06-13 ENCOUNTER — Encounter: Payer: Self-pay | Admitting: Adult Health

## 2024-06-13 VITALS — BP 138/88 | HR 60 | Temp 97.6°F | Resp 20 | Ht 66.4 in | Wt 172.2 lb

## 2024-06-13 DIAGNOSIS — R002 Palpitations: Secondary | ICD-10-CM

## 2024-06-13 DIAGNOSIS — I10 Essential (primary) hypertension: Secondary | ICD-10-CM

## 2024-06-13 DIAGNOSIS — F411 Generalized anxiety disorder: Secondary | ICD-10-CM | POA: Diagnosis not present

## 2024-06-13 NOTE — Patient Instructions (Addendum)
 [phone]  Please call [facility] for an appointment regarding palpitations.  Referral already sent

## 2024-08-18 ENCOUNTER — Ambulatory Visit: Attending: Cardiology | Admitting: Cardiology

## 2024-08-18 ENCOUNTER — Encounter: Payer: Self-pay | Admitting: Cardiology

## 2024-08-18 VITALS — BP 140/100 | HR 62 | Ht 66.0 in | Wt 174.0 lb

## 2024-08-18 DIAGNOSIS — E782 Mixed hyperlipidemia: Secondary | ICD-10-CM | POA: Insufficient documentation

## 2024-08-18 DIAGNOSIS — R002 Palpitations: Secondary | ICD-10-CM | POA: Insufficient documentation

## 2024-08-18 DIAGNOSIS — I10 Essential (primary) hypertension: Secondary | ICD-10-CM | POA: Diagnosis present

## 2024-08-18 MED ORDER — AMLODIPINE BESYLATE 5 MG PO TABS: 5.0000 mg | ORAL_TABLET | Freq: Every day | ORAL | 3 refills | Status: AC

## 2024-08-18 NOTE — Patient Instructions (Signed)
 Medication Instructions:  START Amlodipine  5 mg daily   *If you need a refill on your cardiac medications before your next appointment, please call your pharmacy*  Lab Work: Bmp Magnesium  If you have labs (blood work) drawn today and your tests are completely normal, you will receive your results only by: MyChart Message (if you have MyChart) OR A paper copy in the mail If you have any lab test that is abnormal or we need to change your treatment, we will call you to review the results.  Testing/Procedures: Echocardiogram  Your physician has requested that you have an echocardiogram. Echocardiography is a painless test that uses sound waves to create images of your heart. It provides your doctor with information about the size and shape of your heart and how well your heart's chambers and valves are working. This procedure takes approximately one hour. There are no restrictions for this procedure. Please do NOT wear cologne, perfume, aftershave, or lotions (deodorant is allowed). Please arrive 15 minutes prior to your appointment time.  Please note: We ask at that you not bring children with you during ultrasound (echo/ vascular) testing. Due to room size and safety concerns, children are not allowed in the ultrasound rooms during exams. Our front office staff cannot provide observation of children in our lobby area while testing is being conducted. An adult accompanying a patient to their appointment will only be allowed in the ultrasound room at the discretion of the ultrasound technician under special circumstances. We apologize for any inconvenience.   30 Day Event Monitor   Your physician has recommended that you wear an event monitor. Event monitors are medical devices that record the heart's electrical activity. Doctors most often us  these monitors to diagnose arrhythmias. Arrhythmias are problems with the speed or rhythm of the heartbeat. The monitor is a small, portable device.  You can wear one while you do your normal daily activities. This is usually used to diagnose what is causing palpitations/syncope (passing out).   Follow-Up: At [facility], you and your health needs are our priority.  As part of our continuing mission to provide you with exceptional heart care, our providers are all part of one team.  This team includes your primary Cardiologist (physician) and Advanced Practice Providers or APPs (Physician Assistants and Nurse Practitioners) who all work together to provide you with the care you need, when you need it.  Your next appointment:   3 month(s)  Provider:   Newman JINNY Lawrence, MD

## 2024-08-18 NOTE — Progress Notes (Signed)
 Cardiology Office Note:  .   Date:  08/18/2024  ID:  Kayla Williams, DOB 1951/01/23, MRN [id number] PCP: Leonarda Roxan BROCKS, NP  [facility] Providers Cardiologist:  Newman Lawrence, MD PCP: Leonarda Roxan BROCKS, NP  Chief Complaint  Patient presents with   Palpitations     Kayla Williams is a 73 y.o. female with hypertension, hyperlipidemia, prediabetes, palpitations  Discussed the use of AI scribe software for clinical note transcription with the patient, who gave verbal consent to proceed.  History of Present Illness Patient experiences heart palpitations primarily at night, which often wake her up, and also when sitting or napping. These palpitations had subsided when her potassium levels were corrected but have recently returned. Her potassium level was 2.8 in April, improved to 4.4 in June, and she maintains her potassium through diet without supplements.  She is on losartan  50 mg and metoprolol  50 mg for hypertension. Her family history is significant for heart disease in her mother, who has been on high blood pressure medication since her twenties and is currently 73 years old. She does not smoke or use alcohol.      Vitals:   08/18/24 0848  BP: (!) 140/100  Pulse: 62  SpO2: 97%      Review of Systems  Cardiovascular:  Positive for palpitations. Negative for chest pain, dyspnea on exertion, leg swelling and syncope.        Studies Reviewed: SABRA        EKG 08/18/2024: Normal sinus rhythm Normal ECG When compared with ECG of 04-Apr-2024 01:13, T wave inversion no longer evident in Inferior leads T wave inversion no longer evident in Lateral leads   Labs 04/2024: Chol 157, TG 69, HDL 62, LDL 80 HbA1C 5.8% Hb 13.6 Cr 1.1 TSH 1.9    Physical Exam Vitals and nursing note reviewed.  Constitutional:      General: She is not in acute distress. Neck:     Vascular: No JVD.  Cardiovascular:     Rate and Rhythm: Normal rate and regular  rhythm.     Heart sounds: Normal heart sounds. No murmur heard. Pulmonary:     Effort: Pulmonary effort is normal.     Breath sounds: Normal breath sounds. No wheezing or rales.  Musculoskeletal:     Right lower leg: No edema.     Left lower leg: No edema.     Comments: S/p Right transmetatarsal amputation      VISIT DIAGNOSES:   ICD-10-CM   1. Primary hypertension  I10 EKG 12-Lead    ECHOCARDIOGRAM COMPLETE    Magnesium    Basic metabolic panel with GFR    2. Palpitations  R00.2 ECHOCARDIOGRAM COMPLETE    Magnesium    Basic metabolic panel with GFR    Cardiac event monitor    3. Mixed hyperlipidemia  E78.2        Kayla Williams is a 73 y.o. female with hypertension, hyperlipidemia, prediabetes, palpitations Assessment & Plan Palpitations: Frequent nocturnal palpitations, possible arrhythmia or cardiac rhythm abnormalities. - Provide 30-day heart monitor. - Order echocardiogram. - Reassess potassium levels with blood work.  Primary hypertension: Suboptimal blood pressure control on losartan  and metoprolol . Amlodipine  considered for efficacy in African American patients. - Add amlodipine  5 mg daily. - Continue losartan  and metoprolol . - Encourage diet modification and regular physical activity.  Mixed hyperlipidemia: Controlled with Crestor  20 mg daily.     Meds ordered this encounter  Medications   amLODipine  (NORVASC ) 5  MG tablet    Sig: Take 1 tablet (5 mg total) by mouth daily.    Dispense:  90 tablet    Refill:  3     F/u in 3 months with APP  Signed, Newman JINNY Lawrence, MD

## 2024-08-19 LAB — BASIC METABOLIC PANEL WITH GFR
BUN/Creatinine Ratio: 13 (ref 12–28)
BUN: 14 mg/dL (ref 8–27)
CO2: 23 mmol/L (ref 20–29)
Calcium: 9.8 mg/dL (ref 8.7–10.3)
Chloride: 103 mmol/L (ref 96–106)
Creatinine, Ser: 1.05 mg/dL — ABNORMAL HIGH (ref 0.57–1.00)
Glucose: 80 mg/dL (ref 70–99)
Potassium: 3.9 mmol/L (ref 3.5–5.2)
Sodium: 141 mmol/L (ref 134–144)
eGFR: 56 mL/min/1.73 — ABNORMAL LOW (ref >59)

## 2024-08-19 LAB — MAGNESIUM: Magnesium: 2.2 mg/dL (ref 1.6–2.3)

## 2024-08-20 ENCOUNTER — Ambulatory Visit: Payer: Self-pay | Admitting: Cardiology

## 2024-09-05 ENCOUNTER — Ambulatory Visit: Admitting: Cardiology

## 2024-09-06 ENCOUNTER — Other Ambulatory Visit: Payer: Self-pay | Admitting: Sports Medicine

## 2024-09-06 DIAGNOSIS — F411 Generalized anxiety disorder: Secondary | ICD-10-CM

## 2024-09-27 ENCOUNTER — Ambulatory Visit (HOSPITAL_COMMUNITY)
Admission: RE | Admit: 2024-09-27 | Discharge: 2024-09-27 | Disposition: A | Discharge status: Home / Self Care | Source: Ambulatory Visit | Attending: Cardiology | Admitting: Cardiology

## 2024-09-27 ENCOUNTER — Encounter (HOSPITAL_COMMUNITY): Payer: Self-pay | Admitting: Radiology

## 2024-09-27 ENCOUNTER — Telehealth: Payer: Self-pay | Admitting: Cardiology

## 2024-09-27 DIAGNOSIS — I10 Essential (primary) hypertension: Secondary | ICD-10-CM | POA: Diagnosis not present

## 2024-09-27 DIAGNOSIS — R002 Palpitations: Secondary | ICD-10-CM | POA: Diagnosis present

## 2024-09-27 LAB — ECHOCARDIOGRAM COMPLETE
Area-P 1/2: 4.06 cm2
S' Lateral: 2.5 cm

## 2024-09-27 NOTE — Progress Notes (Signed)
 Left message to call back.

## 2024-09-27 NOTE — Progress Notes (Signed)
 Blood pressure before echocardiogram 142/105 after echocardiogram 197/100 but patient was asymptomatic.

## 2024-09-27 NOTE — Telephone Encounter (Signed)
 Pt returning call. Please advise.

## 2024-09-27 NOTE — Progress Notes (Signed)
 If she is still here, I can see her now.  Thanks MJP

## 2024-09-28 NOTE — Progress Notes (Signed)
 Left message to call back.

## 2024-09-28 NOTE — Telephone Encounter (Signed)
 Spoke with pt who states she is returning a call to [facility], Dr Kellogg nurse.  Pt advised will forward to [facility] as this RN does not see a pending message.  Pt verbalizes understanding and thanked CHARITY FUNDRAISER for the callback.

## 2024-09-28 NOTE — Telephone Encounter (Signed)
 Duplicate. Please review other telephone encounter on 09/27/24.

## 2024-09-29 ENCOUNTER — Ambulatory Visit: Attending: Cardiology

## 2024-09-29 DIAGNOSIS — R002 Palpitations: Secondary | ICD-10-CM

## 2024-10-01 DIAGNOSIS — R002 Palpitations: Secondary | ICD-10-CM | POA: Diagnosis not present

## 2024-10-01 NOTE — Progress Notes (Signed)
 3 episodes of rapid heart beat from bottom chamber of the heart. Structurally normal heart. Continue current medications. Happy to see her sooner if concern remains re: blood pressure control.  Thanks MJP

## 2024-10-02 NOTE — Progress Notes (Signed)
 MyChart message containing providers result note and interpretation read by patient on: Last read by Arlyne JINNY Schlichter at 8:13AM on 10/02/2024.

## 2024-10-10 NOTE — Progress Notes (Signed)
 Finally was able to speak with patient and she reports that her blood pressure has been fine at home. She has been monitoring her blood pressure, but was unable to find her readings at this time. Pt took blood pressure while I was on the phone and her blood pressure was 116/81. Pt will continue to monitor her blood pressure.

## 2024-10-10 NOTE — Progress Notes (Signed)
 Noted.  Thanks MJP

## 2024-11-01 ENCOUNTER — Other Ambulatory Visit: Payer: Self-pay

## 2024-11-01 DIAGNOSIS — D509 Iron deficiency anemia, unspecified: Secondary | ICD-10-CM

## 2024-11-01 DIAGNOSIS — R7303 Prediabetes: Secondary | ICD-10-CM

## 2024-11-01 DIAGNOSIS — E785 Hyperlipidemia, unspecified: Secondary | ICD-10-CM

## 2024-11-01 DIAGNOSIS — E876 Hypokalemia: Secondary | ICD-10-CM

## 2024-11-01 DIAGNOSIS — I10 Essential (primary) hypertension: Secondary | ICD-10-CM

## 2024-11-02 LAB — COMPREHENSIVE METABOLIC PANEL WITH GFR
AG Ratio: 1.6 (calc) (ref 1.0–2.5)
ALT: 16 U/L (ref 6–29)
AST: 22 U/L (ref 10–35)
Albumin: 4.3 g/dL (ref 3.6–5.1)
Alkaline phosphatase (APISO): 39 U/L (ref 37–153)
BUN/Creatinine Ratio: 19 (calc) (ref 6–22)
BUN: 20 mg/dL (ref 7–25)
CO2: 26 mmol/L (ref 20–32)
Calcium: 9.4 mg/dL (ref 8.6–10.4)
Chloride: 105 mmol/L (ref 98–110)
Creat: 1.03 mg/dL — ABNORMAL HIGH (ref 0.60–1.00)
Globulin: 2.7 g/dL (ref 1.9–3.7)
Glucose, Bld: 97 mg/dL (ref 65–99)
Potassium: 3.4 mmol/L — ABNORMAL LOW (ref 3.5–5.3)
Sodium: 140 mmol/L (ref 135–146)
Total Bilirubin: 0.5 mg/dL (ref 0.2–1.2)
Total Protein: 7 g/dL (ref 6.1–8.1)
eGFR: 57 mL/min/1.73m2 — ABNORMAL LOW (ref >=60)

## 2024-11-02 LAB — CBC WITH DIFFERENTIAL/PLATELET
Absolute Lymphocytes: 1332 {cells}/uL (ref 850–3900)
Absolute Monocytes: 279 {cells}/uL (ref 200–950)
Basophils Absolute: 30 {cells}/uL (ref 0–200)
Basophils Relative: 1 %
Eosinophils Absolute: 108 {cells}/uL (ref 15–500)
Eosinophils Relative: 3.6 %
HCT: 40.1 % (ref 35.9–46.0)
Hemoglobin: 13 g/dL (ref 11.7–15.5)
MCH: 28 pg (ref 27.0–33.0)
MCHC: 32.4 g/dL (ref 31.6–35.4)
MCV: 86.2 fL (ref 81.4–101.7)
MPV: 9.7 fL (ref 7.5–12.5)
Monocytes Relative: 9.3 %
Neutro Abs: 1251 {cells}/uL — ABNORMAL LOW (ref 1500–7800)
Neutrophils Relative %: 41.7 %
Platelets: 280 Thousand/uL (ref 140–400)
RBC: 4.65 Million/uL (ref 3.80–5.10)
RDW: 14.5 % (ref 11.0–15.0)
Total Lymphocyte: 44.4 %
WBC: 3 Thousand/uL — ABNORMAL LOW (ref 3.8–10.8)

## 2024-11-02 LAB — HEMOGLOBIN A1C
Hgb A1c MFr Bld: 5.6 % (ref <5.7)
Mean Plasma Glucose: 114 mg/dL
eAG (mmol/L): 6.3 mmol/L

## 2024-11-02 LAB — LIPID PANEL
Cholesterol: 159 mg/dL (ref <200)
HDL: 60 mg/dL (ref >=50)
LDL Cholesterol (Calc): 83 mg/dL
Non-HDL Cholesterol (Calc): 99 mg/dL (ref <130)
Total CHOL/HDL Ratio: 2.7 (calc) (ref <5.0)
Triglycerides: 80 mg/dL (ref <150)

## 2024-11-02 LAB — MICROALBUMIN / CREATININE URINE RATIO
Creatinine, Urine: 141 mg/dL (ref 20–275)
Microalb Creat Ratio: 32 mg/g{creat} — ABNORMAL HIGH (ref <30)
Microalb, Ur: 4.5 mg/dL

## 2024-11-03 ENCOUNTER — Ambulatory Visit: Payer: Self-pay | Admitting: Family

## 2024-11-06 ENCOUNTER — Ambulatory Visit: Payer: Self-pay | Admitting: Family

## 2024-11-06 VITALS — BP 134/68 | HR 64 | Temp 94.6°F | Resp 18 | Ht 66.0 in | Wt 176.4 lb

## 2024-11-06 DIAGNOSIS — D509 Iron deficiency anemia, unspecified: Secondary | ICD-10-CM

## 2024-11-06 DIAGNOSIS — E876 Hypokalemia: Secondary | ICD-10-CM

## 2024-11-06 DIAGNOSIS — N1831 Chronic kidney disease, stage 3a: Secondary | ICD-10-CM

## 2024-11-06 DIAGNOSIS — I10 Essential (primary) hypertension: Secondary | ICD-10-CM

## 2024-11-06 DIAGNOSIS — E785 Hyperlipidemia, unspecified: Secondary | ICD-10-CM

## 2024-11-06 DIAGNOSIS — R7303 Prediabetes: Secondary | ICD-10-CM

## 2024-11-06 DIAGNOSIS — F411 Generalized anxiety disorder: Secondary | ICD-10-CM

## 2024-11-06 LAB — BASIC METABOLIC PANEL WITH GFR
BUN/Creatinine Ratio: 16 (calc) (ref 6–22)
BUN: 16 mg/dL (ref 7–25)
CO2: 29 mmol/L (ref 20–32)
Calcium: 10.1 mg/dL (ref 8.6–10.4)
Chloride: 103 mmol/L (ref 98–110)
Creat: 1.02 mg/dL — ABNORMAL HIGH (ref 0.60–1.00)
Glucose, Bld: 86 mg/dL (ref 65–139)
Potassium: 3.7 mmol/L (ref 3.5–5.3)
Sodium: 142 mmol/L (ref 135–146)
eGFR: 58 mL/min/1.73m2 — ABNORMAL LOW (ref >=60)

## 2024-11-06 NOTE — Patient Instructions (Signed)
 1) Please visit your local pharmacy to receive your covid and shingles vaccine, if you have not already received.

## 2024-11-06 NOTE — Progress Notes (Signed)
 Provider: Roxan Plough FNP-C   Nakiea Metzner, Roxan BROCKS, NP  Patient Care Team: Ryin Ambrosius, Roxan BROCKS, NP as PCP - General (Family Medicine) Elmira Newman PARAS, MD as PCP - Cardiology (Cardiology)  Extended Emergency Contact Information Primary Emergency Contact: Sellars,Elfreda Mobile Phone: [phone] Relation: Daughter Secondary Emergency Contact: Smith,Gwendolyn Mobile Phone: [phone] Relation: Relative Preferred language: English Interpreter needed? No  Code Status:  Full Code  Goals of care: Advanced Directive information    04/26/2024    9:20 AM  Advanced Directives  Does Patient Have a Medical Advance Directive? No  Would patient like information on creating a medical advance directive? No - Patient declined     Chief Complaint  Patient presents with   Medical Management of Chronic Issues    about 6 months follow-up covid and shingles vaccine. Patient did take Blood Pressure and she did decline flu vaccine    Discussed the use of AI scribe software for clinical note transcription with the patient, who gave verbal consent to proceed.  History of Present Illness   Kayla Williams is a 73 year old female who presents for a six-month follow-up.  She has experienced a resolution of her previous episodes of dizziness. No recent palpitations except for one episode while her brother was driving, attributed to stress. Current medications include amlodipine  5 mg, losartan  50 mg, metoprolol  succinate 50 mg daily, and rosuvastatin  20 mg for cholesterol. No issues with muscle pain from rosuvastatin . Takes sertraline  25 mg daily for anxiety but did not notice a difference when off it for several days.  Recent lab work showed an improvement in A1c from 5.8% to 5.6%. Creatinine levels decreased from 1.12 to 1.03. Potassium levels were noted to be low, and she acknowledges a recent decrease in her intake of greens.  She mentions a history of heart sickness and dizziness since  childhood, particularly when riding in a car. She has been drinking more sodas recently, especially during Thanksgiving, which she acknowledges may have impacted her health.  She reports having thick mucus in her throat, which she has to 'hack out', and wonders if it might be related to her medications. No issues with reflux currently.  She has a prosthetic foot and mentions a decrease in physical activity recently. She has a treadmill at home but has not been using it regularly.   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  Has resolved with omeprazole .  Hgb, mcv now up trending with iron  replacement.     Past Surgical History:  Procedure Laterality Date   FOOT AMPUTATION Right 1994   for cancer    Allergies  Allergen Reactions   Lisinopril  Cough    Allergies as of 11/06/2024       Reactions   Lisinopril  Cough        Medication List        Accurate as of November 06, 2024 12:14 PM. If you have any questions, ask your nurse or doctor.          amLODipine  5 MG tablet Commonly known as: NORVASC  Take 1 tablet (5 mg total)  by mouth daily.   losartan  50 MG tablet Commonly known as: Cozaar  Take 1 tablet (50 mg total) by mouth daily.   metoprolol  succinate 50 MG 24 hr tablet Commonly known as: TOPROL -XL Take 1 tablet (50 mg total) by mouth daily. Take with or immediately following a meal.   rosuvastatin  20 MG tablet Commonly known as: Crestor  Take 1 tablet (20 mg total) by mouth daily.   sertraline  25 MG tablet Commonly known as: ZOLOFT  Take 1 tablet by mouth once daily        Review of Systems  Constitutional:  Negative for  appetite change, chills, fatigue, fever and unexpected weight change.  HENT:  Negative for congestion, dental problem, ear discharge, ear pain, facial swelling, hearing loss, nosebleeds, postnasal drip, rhinorrhea, sinus pressure, sinus pain, sneezing, sore throat, tinnitus and trouble swallowing.   Eyes:  Negative for pain, discharge, redness, itching and visual disturbance.  Respiratory:  Negative for cough, chest tightness, shortness of breath and wheezing.   Cardiovascular:  Negative for chest pain, palpitations and leg swelling.  Gastrointestinal:  Negative for abdominal distention, abdominal pain, blood in stool, constipation, diarrhea, nausea and vomiting.  Endocrine: Negative for cold intolerance, heat intolerance, polydipsia, polyphagia and polyuria.  Genitourinary:  Negative for difficulty urinating, dysuria, flank pain, frequency and urgency.  Musculoskeletal:  Negative for arthralgias, back pain, gait problem, joint swelling, myalgias, neck pain and neck stiffness.       Right foot prosthetic   Skin:  Negative for color change, pallor, rash and wound.  Neurological:  Negative for dizziness, syncope, speech difficulty, weakness, light-headedness, numbness and headaches.  Hematological:  Does not bruise/bleed easily.  Psychiatric/Behavioral:  Negative for agitation, behavioral problems, confusion, hallucinations, self-injury, sleep disturbance and suicidal ideas. The patient is not nervous/anxious.     Immunization History  Administered Date(s) Administered   INFLUENZA, HIGH DOSE SEASONAL PF 07/25/2019   Influenza,inj,Quad PF,6+ Mos 07/25/2018   PFIZER(Purple Top)SARS-COV-2 Vaccination 01/28/2020, 02/19/2020   Pneumococcal Conjugate-13 06/27/2018   Pneumococcal Polysaccharide-23 04/09/2021   Tdap 06/27/2018   Pertinent  Health Maintenance Due  Topic Date Due   Influenza Vaccine  02/27/2025 (Originally 06/30/2024)   COLON CANCER SCREENING ANNUAL FOBT  03/05/2025   Mammogram   04/12/2026   Colonoscopy  05/17/2028   Bone Density Scan  Completed      02/01/2023    2:28 PM 03/04/2023    9:51 AM 04/19/2024   11:16 AM 04/26/2024    9:19 AM 05/05/2024    9:16 AM  Fall Risk  Falls in the past year? 0 0 0 0 0  Was there an injury with Fall? 0  0  0  0  0   Fall Risk Category Calculator 0 0 0 0 0  Patient at Risk for Falls Due to History of fall(s);Impaired balance/gait;No Fall Risks History of fall(s) No Fall Risks No Fall Risks No Fall Risks  Fall risk Follow up Falls evaluation completed;Falls prevention discussed Falls evaluation completed Falls evaluation completed Falls evaluation completed;Falls prevention discussed Falls evaluation completed     Data saved with a previous flowsheet row definition   Functional Status Survey:    Vitals:   11/06/24 0910  BP: 134/68  Pulse: 64  Resp: 18  Temp: (!) 94.6 F (34.8 C)  SpO2: 97%  Weight: 176 lb 6.4 oz (80 kg)  Height: 5' 6 (1.676 m)   Body mass index is 28.47 kg/m. Physical Exam Physical Exam   VITALS: T- 94.6, BP- 134/68, SaO2- 97% MEASUREMENTS:  Weight- 176. GENERAL: Alert, cooperative, well developed, no acute distress HEENT: Normocephalic, normal oropharynx, moist mucous membranes, no sinus tenderness, nose normal NECK: Neck supple CHEST: Clear to auscultation bilaterally, no wheezes, rhonchi, or crackles CARDIOVASCULAR: Normal heart rate and rhythm, S1 and S2 normal without murmurs ABDOMEN: Soft, non-tender, non-distended, without organomegaly, normal bowel sounds EXTREMITIES: No cyanosis or edema, extremities normal MUSCULOSKELETAL: No knee tenderness, hips normal range of motion, no ankle swelling, spine normal range of motion.right foot prosthetic  NEUROLOGICAL: Cranial nerves grossly intact, moves all extremities without gross motor or sensory deficit  SKIN: No rash,no lesion or erythema   PSYCHIATRY/BEHAVIORAL: Mood stable   Labs reviewed: Recent Labs    05/16/24 1546 08/18/24 0959  11/01/24 0831  NA 139 141 140  K 4.4 3.9 3.4*  CL 103 103 105  CO2 26 23 26   GLUCOSE 105 80 97  BUN 15 14 20   CREATININE 1.12* 1.05* 1.03*  CALCIUM  10.3 9.8 9.4  MG  --  2.2  --    Recent Labs    03/09/24 2047 05/16/24 1546 11/01/24 0831  AST 25 23 22   ALT 17 20 16   ALKPHOS 43  --   --   BILITOT 0.5 0.6 0.5  PROT 7.5 7.8 7.0  ALBUMIN 4.3  --   --    Recent Labs    04/04/24 0123 05/16/24 1546 11/01/24 0831  WBC 6.7 4.0 3.0*  NEUTROABS  --  2,384 1,251*  HGB 13.0 13.6 13.0  HCT 37.3 41.7 40.1  MCV 79.2* 86.2 86.2  PLT 283 219 280   Lab Results  Component Value Date   TSH 1.92 05/16/2024   Lab Results  Component Value Date   HGBA1C 5.6 11/01/2024   Lab Results  Component Value Date   CHOL 159 11/01/2024   HDL 60 11/01/2024   LDLCALC 83 11/01/2024   TRIG 80 11/01/2024   CHOLHDL 2.7 11/01/2024    Significant Diagnostic Results in last 30 days:  No results found.  Assessment/Plan  Essential hypertension Blood pressure is well-controlled with current medication regimen. No recent episodes of palpitations except for one incident related to external factors. - Continue amlodipine  5 mg daily - Continue losartan  50 mg daily - Continue metoprolol  succinate 50 mg daily  Hyperlipidemia Cholesterol levels are slightly elevated with total cholesterol at 159 mg/dL, HDL decreased to 60 mg/dL, and triglycerides increased to 80 mg/dL. LDL remains at 80 mg/dL. Lack of exercise may be contributing to these changes. - Continue rosuvastatin  20 mg daily - Encouraged regular exercise to improve lipid profile  Prediabetes A1c has improved from 5.8% to 5.6%, indicating better glycemic control. Current glucose level is 97 mg/dL. - Continue current lifestyle modifications to maintain A1c below 5.7%  Stage 3a chronic kidney disease Kidney function is slightly impaired with creatinine at 1.03 mg/dL, improved from previous levels. Microalbumin is slightly elevated at 32  mg/dL. - Continue monitoring kidney function - Encouraged blood pressure control to protect kidney function  Hypokalemia Potassium levels are low, possibly due to dietary changes. - Rechecked potassium levels - Encouraged consumption of potassium-rich foods such as green vegetables and bananas  Generalized anxiety disorder Anxiety is well-managed with current medication. No need for dose adjustment as she reports no significant difference when not taking the medication.  General health maintenance Immunizations are up to date except for the shingles vaccine. Mammogram and colonoscopy are current. Bone density is up to date. - Obtain shingles vaccine at pharmacy - Continue routine health screenings  as scheduled   Family/ staff Communication: Reviewed plan of care with patient verbalized understanding   Labs/tests ordered:  - CBC with Differential/Platelet - CMP with eGFR([facility]) - TSH - Hgb A1C - Lipid panel - BMP   Next Appointment : Return in about 6 months (around 05/07/2025) for medical mangement of chronic issues., Fasting labs in 6 months prior to visit.   Spent 30 minutes of Face to face and non-face to face with patient  >50% time spent counseling; reviewing medical record; tests; labs; documentation and developing future plan of care.   Roxan JAYSON Plough, NP

## 2024-11-07 ENCOUNTER — Ambulatory Visit: Payer: Self-pay | Admitting: Family

## 2024-11-17 ENCOUNTER — Ambulatory Visit: Attending: Cardiology | Admitting: Cardiology

## 2024-11-17 ENCOUNTER — Encounter: Payer: Self-pay | Admitting: Cardiology

## 2024-11-17 VITALS — BP 146/88 | HR 66 | Ht 66.0 in | Wt 173.5 lb

## 2024-11-17 DIAGNOSIS — R0683 Snoring: Secondary | ICD-10-CM | POA: Insufficient documentation

## 2024-11-17 DIAGNOSIS — R002 Palpitations: Secondary | ICD-10-CM | POA: Diagnosis not present

## 2024-11-17 DIAGNOSIS — E782 Mixed hyperlipidemia: Secondary | ICD-10-CM | POA: Diagnosis not present

## 2024-11-17 DIAGNOSIS — I4729 Other ventricular tachycardia: Secondary | ICD-10-CM | POA: Diagnosis not present

## 2024-11-17 DIAGNOSIS — I10 Essential (primary) hypertension: Secondary | ICD-10-CM | POA: Insufficient documentation

## 2024-11-17 NOTE — Patient Instructions (Addendum)
 :Medication Instructions:   No changes *If you need a refill on your cardiac medications before your next appointment, please call your pharmacy*   Lab Work: Not needed    Testing/Procedures:  Your physician has recommended that you have a  home sleep study- [facility] . T   Follow-Up: At [facility], you and your health needs are our priority.  As part of our continuing mission to provide you with exceptional heart care, we have created designated Provider Care Teams.  These Care Teams include your primary Cardiologist (physician) and Advanced Practice Providers (APPs -  Physician Assistants and Nurse Practitioners) who all work together to provide you with the care you need, when you need it.     Your next appointment:   12 month(s)  The format for your next appointment:   In Person  Provider:   Newman JINNY Lawrence, MD or One of our Advanced Practice Providers (APPs): Morse Clause, PA-C  Lamarr Satterfield, NP Miriam Shams, NP  Olivia Pavy, PA-C Josefa Beauvais, NP  Leontine Salen, PA-C Orren Fabry, PA-C  Wilmore, PA-C Ernest Dick, NP  Damien Braver, NP Jon Hails, PA-C  Waddell Donath, PA-C    Dayna Dunn, PA-C  Scott Weaver, PA-C Lum Louis, NP Katlyn West, NP Callie Goodrich, PA-C  Xika Zhao, NP Sheng Haley, PA-C    Kathleen Johnson, PA-C   Then,  will plan to see you again in  .   Other Instructions  WatchPAT?  Is a FDA cleared portable home sleep study test that uses a watch and 3 points of contact to monitor 7 different channels, including your heart rate, oxygen saturations, body position, snoring, and chest motion.  The study is easy to use from the comfort of your own home and accurately detect sleep apnea.  Before bed, you attach the chest sensor, attached the sleep apnea bracelet to your nondominant hand, and attach the finger probe.  After the study, the raw data is downloaded from the watch and scored for apnea events.   For more information:  [email or web address]  Patient Testing Instructions:  Do not put battery into the device until bedtime when you are ready to begin the test. Please call the support number if you need assistance after following the instructions below: 24 hour support line- [phone] or [facility] support at [phone] (option 2)  Download the [facility] WatchPAT One app through the google play store or App Store  Be sure to turn on or enable access to bluetooth in settlings on your smartphone/ device  Make sure no other bluetooth devices are on and within the vicinity of your smartphone/ device and WatchPAT watch during testing.  Make sure to leave your smart phone/ device plugged in and charging all night.  When ready for bed:  Follow the instructions step by step in the WatchPAT One App to activate the testing device. For additional instructions, including video instruction, visit the WatchPAT One video on Youtube. You can search for WatchPat One within Youtube (video is 4 minutes and 18 seconds) or enter: https://youtube/[email or web address]?[email or web address]_vbiwxE Please note: You will be prompted to enter a Pin to connect via bluetooth when starting the test. The PIN will be assigned to you when you receive the test.  The device is disposable, but it recommended that you retain the device until you receive a call letting you know the study has been received and the results have been interpreted.  We will let you know if the study did not transmit to us  properly  after the test is completed. You do not need to call us  to confirm the receipt of the test.  Please complete the test within 48 hours of receiving PIN.   Frequently Asked Questions:  What is Watch Bruna one?  A single use fully disposable home sleep apnea testing device and will not need to be returned after completion.  What are the requirements to use WatchPAT one?  The be able to have a successful watchpat one sleep study, you should have your Watch pat one  device, your smart phone, watch pat one app, your PIN number and Internet access What type of phone do I need?  You should have a smart phone that uses Android 5.1 and above or any Iphone with IOS 10 and above How can I download the WatchPAT one app?  Based on your device type search for WatchPAT one app either in google play for android devices or APP store for Iphone's Where will I get my PIN for the study?  Your PIN will be provided by your physician's office. It is used for authentication and if you lose/forget your PIN, please reach out to your providers office.  I do not have Internet at home. Can I do WatchPAT one study?  WatchPAT One needs Internet connection throughout the night to be able to transmit the sleep data. You can use your home/local internet or your cellular's data package. However, it is always recommended to use home/local Internet. It is estimated that between 20MB-30MB will be used with each study.However, the application will be looking for space in the phone to start the study.  What happens if I lose internet or bluetooth connection?  During the internet disconnection, your phone will not be able to transmit the sleep data. All the data, will be stored in your phone. As soon as the internet connection is back on, the phone will being sending the sleep data. During the bluetooth disconnection, WatchPAT one will not be able to to send the sleep data to your phone. Data will be kept in the WatchPAT one until two devices have bluetooth connection back on. As soon as the connection is back on, WatchPAT one will send the sleep data to the phone.  How long do I need to wear the WatchPAT one?  After you start the study, you should wear the device at least 6 hours.  How far should I keep my phone from the device?  During the night, your phone should be within 15 feet.  What happens if I leave the room for restroom or other reasons?  Leaving the room for any reason will not  cause any problem. As soon as your get back to the room, both devices will reconnect and will continue to send the sleep data. Can I use my phone during the sleep study?  Yes, you can use your phone as usual during the study. But it is recommended to put your watchpat one on when you are ready to go to bed.  How will I get my study results?  A soon as you completed your study, your sleep data will be sent to the provider. They will then share the results with you when they are ready.

## 2024-11-17 NOTE — Progress Notes (Signed)
 " Cardiology Office Note:  .   Date:  11/17/2024  ID:  Kayla Williams, DOB [birth year], MRN [id number] PCP: Leonarda Roxan BROCKS, NP  [facility] Providers Cardiologist:  Newman Lawrence, MD PCP: Leonarda Roxan BROCKS, NP  Chief Complaint  Patient presents with   Palpitations          Kayla Williams is a 73 y.o. female with hypertension, hyperlipidemia, prediabetes, palpitations, NSVT  History of Present Illness Palpitation symptoms have improved since last visit.  Blood pressure elevated today, but generally lower than this.  She does snore at night, but has never had sleep study.    Vitals:   11/17/24 0809  BP: (!) 146/88  Pulse: 66  SpO2: 97%      Review of Systems  Cardiovascular:  Positive for palpitations. Negative for chest pain, dyspnea on exertion, leg swelling and syncope.        Studies Reviewed: SABRA        EKG 08/18/2024: Normal sinus rhythm Normal ECG When compared with ECG of 04-Apr-2024 01:13, T wave inversion no longer evident in Inferior leads T wave inversion no longer evident in Lateral leads  Echocardiogram 08/2024: 1. Left ventricular ejection fraction, by estimation, is 65 to 70%. The  left ventricle has normal function. The left ventricle has no regional  wall motion abnormalities. Left ventricular diastolic parameters were  normal. The average left ventricular global longitudinal strain is -18.7 %. The global longitudinal strain is normal.   2. Right ventricular systolic function is normal. The right ventricular  size is normal.   3. Left atrial size was mildly dilated.   4. The mitral valve is normal in structure. No evidence of mitral valve  regurgitation. No evidence of mitral stenosis.   5. The aortic valve is tricuspid. Aortic valve regurgitation is not  visualized. No aortic stenosis is present.   Comparison(s): Unable to view 2019 study.   Mobile cardiac outpatient telemetry 30 days 08/27/2024 - 09/25/2024: Dominant  rhythm: Sinus. HR 46-161 bpm. Avg HR 65 bpm. 3 episodes of VT, fastest at 161 bpm for 13 beats. 1% PVC, PACs. No atrial fibrillation/atrial flutter/SVT/high grade AV block, sinus pause >3sec noted. 2 patient triggered, 24 auto triggered events.     Labs 04/2024: Chol 157, TG 69, HDL 62, LDL 80 HbA1C 5.8% Hb 13.6 Cr 1.1 TSH 1.9    Physical Exam Vitals and nursing note reviewed.  Constitutional:      General: She is not in acute distress. Neck:     Vascular: No JVD.  Cardiovascular:     Rate and Rhythm: Normal rate and regular rhythm.     Heart sounds: Normal heart sounds. No murmur heard. Pulmonary:     Effort: Pulmonary effort is normal.     Breath sounds: Normal breath sounds. No wheezing or rales.  Musculoskeletal:     Right lower leg: No edema.     Left lower leg: No edema.     Comments: S/p Right transmetatarsal amputation      VISIT DIAGNOSES:   ICD-10-CM   1. NSVT (nonsustained ventricular tachycardia) (HCC)  I47.29     2. Palpitations  R00.2     3. Snoring  R06.83 [facility] Sleep Study    4. Primary hypertension  I10 [facility] Sleep Study    5. Mixed hyperlipidemia  E78.2         Kayla Williams is a 73 y.o. female with hypertension, hyperlipidemia, prediabetes, palpitations, NSVT Assessment & Plan Palpitations, NSVT:  Episodes of NSVT up to 13 beats.  Structurally normal heart.  Continue metoprolol  succinate 50 mg daily.  Given her snoring and possibility of underlying OSA, refer to sleep study.  Primary hypertension: Blood pressure elevated today, but generally better controlled than this.  She is currently on amlodipine  5 mg daily, losartan  50 mg nightly, metoprolol  succinate 50 mg daily.  Recommend keeping regular log of blood pressure readings send continue follow-up with PCP.  In future, could consider uptitrating amlodipine  and losartan  dose, or add diuretic to achieve goal of <130/80 mmHg.    Mixed hyperlipidemia: Controlled with Crestor  20 mg  daily.   F/u in 1 year  Signed, Newman JINNY Lawrence, MD  "

## 2024-12-03 ENCOUNTER — Encounter (HOSPITAL_BASED_OUTPATIENT_CLINIC_OR_DEPARTMENT_OTHER): Payer: Self-pay | Admitting: Cardiology

## 2024-12-03 DIAGNOSIS — G4733 Obstructive sleep apnea (adult) (pediatric): Secondary | ICD-10-CM | POA: Diagnosis not present

## 2024-12-05 NOTE — Procedures (Signed)
" ° °  SLEEP STUDY REPORT Patient Information Study Date: 12/03/2024 Patient Name: Kayla Williams Patient ID: [id number] Birth Date: 12/29/1950 Age: 74 Gender: Female BMI: 28.0 (W=174 lb, H=5' 6'') Referring Physician: Newman Lawrence, MD  TEST DESCRIPTION: Home sleep apnea testing was completed using the WatchPat, a Type 1 device, utilizing  peripheral arterial tonometry (PAT), chest movement, actigraphy, pulse oximetry, pulse rate, body position and snore.  AHI was calculated with apnea and hypopnea using valid sleep time as the denominator. RDI includes apneas,  hypopneas, and RERAs. The data acquired and the scoring of sleep and all associated events were performed in  accordance with the recommended standards and specifications as outlined in the AASM Manual for the Scoring of  Sleep and Associated Events 2.2.0 (2015).   FINDINGS:   1. Mild Obstructive Sleep Apnea with AHI 8.6/hr overall but severe during REM Sleep with REM AHI 31.9/hr.   2. No Central Sleep Apnea with pAHIc 0.6/hr.   3. Oxygen desaturations as low as 86%.   4. Severe snoring was present. O2 sats were < 88% for 2.9 min.   5. Total sleep time was 6 hrs and 35 min.   6. 16.2% of total sleep time was spent in REM sleep.   7. Normal sleep onset latency at 19 min.   8. Prolonged REM sleep onset latency at 204 min.   9. Total awakenings were 9.  10. Arrhythmia detection: None  DIAGNOSIS: Mild Obstructive Sleep Apnea (G47.33) overall but Severe during REM sleep.  RECOMMENDATIONS: 1. Clinical correlation of these findings is necessary. The decision to treat obstructive sleep apnea (OSA) is usually  based on the presence of apnea symptoms or the presence of associated medical conditions such as Hypertension,  Congestive Heart Failure, Atrial Fibrillation or Obesity. The most common symptoms of OSA are snoring, gasping for  breath while sleeping, daytime sleepiness and fatigue.  2. Initiating apnea therapy is  recommended given the presence of symptoms and/or associated conditions.  Recommend proceeding with one of the following:  a. Auto-CPAP therapy with a pressure range of 5-20cm H2O.  b. An oral appliance (OA) that can be obtained from certain dentists with expertise in sleep medicine. These are  primarily of use in non-obese patients with mild and moderate disease.  c. An ENT consultation which may be useful to look for specific causes of obstruction and possible treatment  options.  d. If patient is intolerant to PAP therapy, consider referral to ENT for evaluation for hypoglossal nerve stimulator.  3. Close follow-up is necessary to ensure success with CPAP or oral appliance therapy for maximum benefit . 4. A follow-up oximetry study on CPAP is recommended to assess the adequacy of therapy and determine the need  for supplemental oxygen or the potential need for Bi-level therapy. An arterial blood gas to determine the adequacy of  baseline ventilation and oxygenation should also be considered. 5. Healthy sleep recommendations include: adequate nightly sleep (normal 7-9 hrs/night), avoidance of caffeine after  noon and alcohol near bedtime, and maintaining a sleep environment that is cool, dark and quiet. 6. Weight loss for overweight patients is recommended. Even modest amounts of weight loss can significantly  improve the severity of sleep apnea. 7. Snoring recommendations include: weight loss where appropriate, side sleeping, and avoidance of alcohol before  bed. 8. Operation of motor vehicle should be avoided when sleepy.  Signature: Wilbert Bihari, MD; [facility]; Diplomat, American Board of Sleep  Medicine Electronically Signed: 12/05/2024 10:29:22 AM "

## 2025-04-25 ENCOUNTER — Ambulatory Visit

## 2025-05-07 ENCOUNTER — Other Ambulatory Visit

## 2025-05-09 ENCOUNTER — Ambulatory Visit: Admitting: Family

## 2025-05-16 ENCOUNTER — Ambulatory Visit: Admitting: Family
